# Patient Record
Sex: Female | Born: 1950 | Race: White | Hispanic: No | Smoking: Light tobacco smoker
Health system: Southern US, Community
[De-identification: ages and names within clinical notes are randomized; demographics above are authoritative.]

## PROBLEM LIST (undated history)

## (undated) DIAGNOSIS — M1990 Unspecified osteoarthritis, unspecified site: Secondary | ICD-10-CM

## (undated) DIAGNOSIS — I4891 Unspecified atrial fibrillation: Secondary | ICD-10-CM

---

## 2004-07-24 ENCOUNTER — Ambulatory Visit: Payer: Self-pay

## 2004-12-11 ENCOUNTER — Ambulatory Visit: Payer: Self-pay | Admitting: Physician Assistant

## 2004-12-20 ENCOUNTER — Ambulatory Visit: Payer: Self-pay | Admitting: Pain Medicine

## 2005-01-08 ENCOUNTER — Ambulatory Visit: Payer: Self-pay | Admitting: Physician Assistant

## 2005-01-22 ENCOUNTER — Ambulatory Visit: Payer: Self-pay | Admitting: Pain Medicine

## 2005-02-21 ENCOUNTER — Ambulatory Visit: Payer: Self-pay | Admitting: Physician Assistant

## 2005-02-28 ENCOUNTER — Ambulatory Visit: Payer: Self-pay | Admitting: Pain Medicine

## 2005-03-12 ENCOUNTER — Ambulatory Visit: Payer: Self-pay | Admitting: Pain Medicine

## 2005-03-25 ENCOUNTER — Ambulatory Visit: Payer: Self-pay | Admitting: Pain Medicine

## 2005-06-03 ENCOUNTER — Ambulatory Visit: Payer: Self-pay | Admitting: General Surgery

## 2005-06-10 ENCOUNTER — Other Ambulatory Visit: Payer: Self-pay

## 2005-06-17 ENCOUNTER — Inpatient Hospital Stay: Payer: Self-pay | Admitting: General Surgery

## 2005-12-31 ENCOUNTER — Ambulatory Visit: Payer: Self-pay

## 2009-09-19 ENCOUNTER — Ambulatory Visit: Payer: Self-pay | Admitting: Internal Medicine

## 2011-04-03 ENCOUNTER — Ambulatory Visit: Payer: Self-pay | Admitting: Internal Medicine

## 2012-03-30 ENCOUNTER — Inpatient Hospital Stay: Payer: Self-pay | Admitting: Family Medicine

## 2012-03-30 LAB — URINALYSIS, COMPLETE
Bilirubin,UR: NEGATIVE
Blood: NEGATIVE
Glucose,UR: NEGATIVE mg/dL (ref 0–75)
Ketone: NEGATIVE
Leukocyte Esterase: NEGATIVE
Nitrite: NEGATIVE
Ph: 7 (ref 4.5–8.0)
Specific Gravity: 1.009 (ref 1.003–1.030)
Squamous Epithelial: 2
WBC UR: 2 /HPF (ref 0–5)

## 2012-03-30 LAB — CBC
MCHC: 32.7 g/dL (ref 32.0–36.0)
Platelet: 396 10*3/uL (ref 150–440)
RBC: 4.63 10*6/uL (ref 3.80–5.20)
WBC: 12.4 10*3/uL — ABNORMAL HIGH (ref 3.6–11.0)

## 2012-03-30 LAB — COMPREHENSIVE METABOLIC PANEL
Albumin: 3.9 g/dL (ref 3.4–5.0)
Alkaline Phosphatase: 112 U/L (ref 50–136)
Anion Gap: 6 — ABNORMAL LOW (ref 7–16)
Bilirubin,Total: 0.7 mg/dL (ref 0.2–1.0)
Calcium, Total: 9.2 mg/dL (ref 8.5–10.1)
Co2: 34 mmol/L — ABNORMAL HIGH (ref 21–32)
EGFR (Non-African Amer.): >60
Glucose: 123 mg/dL — ABNORMAL HIGH (ref 65–99)
Osmolality: 265 (ref 275–301)
SGPT (ALT): 34 U/L (ref 12–78)
Sodium: 133 mmol/L — ABNORMAL LOW (ref 136–145)
Total Protein: 7.8 g/dL (ref 6.4–8.2)

## 2012-03-31 LAB — CBC WITH DIFFERENTIAL/PLATELET
Basophil %: 0.2 %
Eosinophil #: 0 10*3/uL (ref 0.0–0.7)
Eosinophil %: 0.2 %
HCT: 43.3 % (ref 35.0–47.0)
HGB: 15.2 g/dL (ref 12.0–16.0)
Lymphocyte #: 1.5 10*3/uL (ref 1.0–3.6)
Lymphocyte %: 10.1 %
MCV: 100 fL (ref 80–100)
Monocyte #: 0.7 x10 3/mm (ref 0.2–0.9)
Neutrophil #: 12.3 10*3/uL — ABNORMAL HIGH (ref 1.4–6.5)
Neutrophil %: 84.9 %
Platelet: 371 10*3/uL (ref 150–440)
RBC: 4.34 10*6/uL (ref 3.80–5.20)
RDW: 12.8 % (ref 11.5–14.5)
WBC: 14.4 10*3/uL — ABNORMAL HIGH (ref 3.6–11.0)

## 2012-03-31 LAB — BASIC METABOLIC PANEL
Anion Gap: 7 (ref 7–16)
BUN: 6 mg/dL — ABNORMAL LOW (ref 7–18)
Chloride: 95 mmol/L — ABNORMAL LOW (ref 98–107)
Co2: 31 mmol/L (ref 21–32)
Creatinine: 0.4 mg/dL — ABNORMAL LOW (ref 0.60–1.30)
EGFR (Non-African Amer.): >60
Potassium: 3.8 mmol/L (ref 3.5–5.1)
Sodium: 133 mmol/L — ABNORMAL LOW (ref 136–145)

## 2012-04-01 LAB — CBC WITH DIFFERENTIAL/PLATELET
Basophil #: 0 10*3/uL (ref 0.0–0.1)
Eosinophil #: 0 10*3/uL (ref 0.0–0.7)
HCT: 45.3 % (ref 35.0–47.0)
HGB: 15.7 g/dL (ref 12.0–16.0)
Lymphocyte %: 5.5 %
MCH: 34.7 pg — ABNORMAL HIGH (ref 26.0–34.0)
MCHC: 34.7 g/dL (ref 32.0–36.0)
Neutrophil #: 11.8 10*3/uL — ABNORMAL HIGH (ref 1.4–6.5)
Platelet: 377 10*3/uL (ref 150–440)
RDW: 12.5 % (ref 11.5–14.5)

## 2012-04-01 LAB — BASIC METABOLIC PANEL
Anion Gap: 8 (ref 7–16)
Calcium, Total: 9.3 mg/dL (ref 8.5–10.1)
Chloride: 91 mmol/L — ABNORMAL LOW (ref 98–107)
Co2: 31 mmol/L (ref 21–32)
Creatinine: 0.62 mg/dL (ref 0.60–1.30)
Osmolality: 263 (ref 275–301)

## 2012-04-02 LAB — CBC WITH DIFFERENTIAL/PLATELET
Basophil #: 0 10*3/uL (ref 0.0–0.1)
Basophil %: 0.1 %
Eosinophil #: 0 10*3/uL (ref 0.0–0.7)
Eosinophil %: 0 %
HCT: 44.4 % (ref 35.0–47.0)
Lymphocyte #: 0.7 10*3/uL — ABNORMAL LOW (ref 1.0–3.6)
Lymphocyte %: 5.4 %
Monocyte #: 0.3 x10 3/mm (ref 0.2–0.9)
Monocyte %: 2.3 %
Neutrophil #: 11.2 10*3/uL — ABNORMAL HIGH (ref 1.4–6.5)
RBC: 4.43 10*6/uL (ref 3.80–5.20)
RDW: 12.6 % (ref 11.5–14.5)
WBC: 12.2 10*3/uL — ABNORMAL HIGH (ref 3.6–11.0)

## 2012-04-02 LAB — BASIC METABOLIC PANEL
Anion Gap: 6 — ABNORMAL LOW (ref 7–16)
BUN: 13 mg/dL (ref 7–18)
Co2: 35 mmol/L — ABNORMAL HIGH (ref 21–32)
Creatinine: 0.56 mg/dL — ABNORMAL LOW (ref 0.60–1.30)
EGFR (African American): >60
EGFR (Non-African Amer.): >60
Sodium: 131 mmol/L — ABNORMAL LOW (ref 136–145)

## 2012-04-03 LAB — CBC WITH DIFFERENTIAL/PLATELET
Basophil #: 0 10*3/uL (ref 0.0–0.1)
Basophil %: 0.2 %
Eosinophil #: 0 10*3/uL (ref 0.0–0.7)
HCT: 44.1 % (ref 35.0–47.0)
HGB: 15.3 g/dL (ref 12.0–16.0)
Lymphocyte #: 1.5 10*3/uL (ref 1.0–3.6)
Lymphocyte %: 11.7 %
MCH: 34.4 pg — ABNORMAL HIGH (ref 26.0–34.0)
MCHC: 34.7 g/dL (ref 32.0–36.0)
MCV: 99 fL (ref 80–100)
Monocyte %: 10.7 %
Neutrophil #: 10.1 10*3/uL — ABNORMAL HIGH (ref 1.4–6.5)

## 2012-04-03 LAB — BASIC METABOLIC PANEL
Creatinine: 0.46 mg/dL — ABNORMAL LOW (ref 0.60–1.30)
EGFR (African American): >60
EGFR (Non-African Amer.): >60
Glucose: 109 mg/dL — ABNORMAL HIGH (ref 65–99)
Osmolality: 273 (ref 275–301)
Potassium: 3.6 mmol/L (ref 3.5–5.1)
Sodium: 136 mmol/L (ref 136–145)

## 2012-04-18 ENCOUNTER — Emergency Department: Payer: Self-pay | Admitting: Emergency Medicine

## 2012-04-18 LAB — COMPREHENSIVE METABOLIC PANEL
Albumin: 3.2 g/dL — ABNORMAL LOW (ref 3.4–5.0)
Anion Gap: 6 — ABNORMAL LOW (ref 7–16)
BUN: 8 mg/dL (ref 7–18)
Calcium, Total: 8.3 mg/dL — ABNORMAL LOW (ref 8.5–10.1)
Co2: 35 mmol/L — ABNORMAL HIGH (ref 21–32)
EGFR (African American): >60
EGFR (Non-African Amer.): >60
Glucose: 108 mg/dL — ABNORMAL HIGH (ref 65–99)
Potassium: 3.2 mmol/L — ABNORMAL LOW (ref 3.5–5.1)
SGOT(AST): 31 U/L (ref 15–37)
Sodium: 138 mmol/L (ref 136–145)

## 2012-04-18 LAB — CBC
HCT: 38.5 % (ref 35.0–47.0)
HGB: 13.1 g/dL (ref 12.0–16.0)
MCH: 33.3 pg (ref 26.0–34.0)
Platelet: 327 10*3/uL (ref 150–440)
RDW: 12.7 % (ref 11.5–14.5)
WBC: 12.5 10*3/uL — ABNORMAL HIGH (ref 3.6–11.0)

## 2012-04-18 LAB — TROPONIN I: Troponin-I: <0.02 ng/mL

## 2012-04-18 LAB — CK TOTAL AND CKMB (NOT AT ARMC): CK, Total: 132 U/L (ref 21–215)

## 2012-05-04 ENCOUNTER — Inpatient Hospital Stay: Payer: Self-pay

## 2012-05-04 LAB — CBC
MCH: 32.4 pg (ref 26.0–34.0)
Platelet: 367 10*3/uL (ref 150–440)
RDW: 13.4 % (ref 11.5–14.5)
WBC: 15.6 10*3/uL — ABNORMAL HIGH (ref 3.6–11.0)

## 2012-05-04 LAB — BASIC METABOLIC PANEL
Anion Gap: 4 — ABNORMAL LOW (ref 7–16)
BUN: 8 mg/dL (ref 7–18)
Co2: 35 mmol/L — ABNORMAL HIGH (ref 21–32)
EGFR (African American): >60
EGFR (Non-African Amer.): >60
Glucose: 126 mg/dL — ABNORMAL HIGH (ref 65–99)
Osmolality: 274 (ref 275–301)
Potassium: 3.9 mmol/L (ref 3.5–5.1)

## 2012-05-04 LAB — CK TOTAL AND CKMB (NOT AT ARMC): CK, Total: 93 U/L (ref 21–215)

## 2012-05-05 LAB — BASIC METABOLIC PANEL
Anion Gap: 3 — ABNORMAL LOW (ref 7–16)
BUN: 11 mg/dL (ref 7–18)
Chloride: 97 mmol/L — ABNORMAL LOW (ref 98–107)
Co2: 36 mmol/L — ABNORMAL HIGH (ref 21–32)
Creatinine: 0.47 mg/dL — ABNORMAL LOW (ref 0.60–1.30)
EGFR (Non-African Amer.): >60
Glucose: 155 mg/dL — ABNORMAL HIGH (ref 65–99)
Osmolality: 274 (ref 275–301)
Potassium: 3.9 mmol/L (ref 3.5–5.1)
Sodium: 136 mmol/L (ref 136–145)

## 2012-05-05 LAB — CBC WITH DIFFERENTIAL/PLATELET
Basophil #: 0 10*3/uL (ref 0.0–0.1)
Basophil %: 0.2 %
HGB: 12.3 g/dL (ref 12.0–16.0)
Lymphocyte #: 0.5 10*3/uL — ABNORMAL LOW (ref 1.0–3.6)
MCHC: 33 g/dL (ref 32.0–36.0)
Monocyte #: 0.3 x10 3/mm (ref 0.2–0.9)
Neutrophil #: 13.1 10*3/uL — ABNORMAL HIGH (ref 1.4–6.5)
Neutrophil %: 93.9 %
RDW: 13.2 % (ref 11.5–14.5)

## 2012-05-05 LAB — MAGNESIUM: Magnesium: 2 mg/dL

## 2012-05-06 LAB — BASIC METABOLIC PANEL
Chloride: 90 mmol/L — ABNORMAL LOW (ref 98–107)
Co2: 38 mmol/L — ABNORMAL HIGH (ref 21–32)
Creatinine: 0.44 mg/dL — ABNORMAL LOW (ref 0.60–1.30)
Glucose: 141 mg/dL — ABNORMAL HIGH (ref 65–99)
Osmolality: 267 (ref 275–301)
Potassium: 4.3 mmol/L (ref 3.5–5.1)

## 2012-05-07 LAB — BASIC METABOLIC PANEL
EGFR (Non-African Amer.): >60
Glucose: 157 mg/dL — ABNORMAL HIGH (ref 65–99)
Osmolality: 275 (ref 275–301)
Sodium: 135 mmol/L — ABNORMAL LOW (ref 136–145)

## 2012-05-09 LAB — CBC WITH DIFFERENTIAL/PLATELET
Basophil #: 0 10*3/uL (ref 0.0–0.1)
Eosinophil #: 0 10*3/uL (ref 0.0–0.7)
HCT: 39.5 % (ref 35.0–47.0)
HGB: 13.3 g/dL (ref 12.0–16.0)
Lymphocyte #: 0.6 10*3/uL — ABNORMAL LOW (ref 1.0–3.6)
Lymphocyte %: 7 %
MCH: 33.3 pg (ref 26.0–34.0)
MCHC: 33.7 g/dL (ref 32.0–36.0)
Monocyte #: 0.5 x10 3/mm (ref 0.2–0.9)
Monocyte %: 5.8 %
Neutrophil #: 7.1 10*3/uL — ABNORMAL HIGH (ref 1.4–6.5)
RDW: 13 % (ref 11.5–14.5)

## 2012-05-09 LAB — BASIC METABOLIC PANEL
Chloride: 93 mmol/L — ABNORMAL LOW (ref 98–107)
EGFR (African American): >60
Glucose: 122 mg/dL — ABNORMAL HIGH (ref 65–99)
Sodium: 139 mmol/L (ref 136–145)

## 2012-06-25 ENCOUNTER — Ambulatory Visit: Payer: Self-pay

## 2013-09-15 ENCOUNTER — Inpatient Hospital Stay: Payer: Self-pay | Admitting: Family Medicine

## 2013-09-15 LAB — CBC WITH DIFFERENTIAL/PLATELET
BASOS ABS: 0 10*3/uL (ref 0.0–0.1)
Basophil %: 0.4 %
Eosinophil #: 0.2 10*3/uL (ref 0.0–0.7)
Eosinophil %: 2.6 %
HCT: 38.4 % (ref 35.0–47.0)
HGB: 12.6 g/dL (ref 12.0–16.0)
LYMPHS ABS: 1.4 10*3/uL (ref 1.0–3.6)
LYMPHS PCT: 15.5 %
MCH: 33.3 pg (ref 26.0–34.0)
MCHC: 32.8 g/dL (ref 32.0–36.0)
MCV: 102 fL — ABNORMAL HIGH (ref 80–100)
MONO ABS: 0.8 x10 3/mm (ref 0.2–0.9)
Monocyte %: 8.3 %
NEUTROS ABS: 6.8 10*3/uL — AB (ref 1.4–6.5)
Neutrophil %: 73.2 %
Platelet: 251 10*3/uL (ref 150–440)
RBC: 3.78 10*6/uL — ABNORMAL LOW (ref 3.80–5.20)
RDW: 13.9 % (ref 11.5–14.5)
WBC: 9.3 10*3/uL (ref 3.6–11.0)

## 2013-09-15 LAB — COMPREHENSIVE METABOLIC PANEL
ANION GAP: 4 — AB (ref 7–16)
Albumin: 3.3 g/dL — ABNORMAL LOW (ref 3.4–5.0)
Alkaline Phosphatase: 113 U/L
BILIRUBIN TOTAL: 0.5 mg/dL (ref 0.2–1.0)
BUN: 7 mg/dL (ref 7–18)
CALCIUM: 9 mg/dL (ref 8.5–10.1)
CHLORIDE: 95 mmol/L — AB (ref 98–107)
Co2: 39 mmol/L — ABNORMAL HIGH (ref 21–32)
Creatinine: 0.42 mg/dL — ABNORMAL LOW (ref 0.60–1.30)
EGFR (African American): >60
GLUCOSE: 126 mg/dL — AB (ref 65–99)
OSMOLALITY: 275 (ref 275–301)
Potassium: 4 mmol/L (ref 3.5–5.1)
SGOT(AST): 42 U/L — ABNORMAL HIGH (ref 15–37)
SGPT (ALT): 33 U/L (ref 12–78)
Sodium: 138 mmol/L (ref 136–145)
TOTAL PROTEIN: 6.7 g/dL (ref 6.4–8.2)

## 2013-09-15 LAB — TSH: THYROID STIMULATING HORM: 2.42 u[IU]/mL

## 2013-09-15 LAB — TROPONIN I

## 2013-09-15 LAB — PRO B NATRIURETIC PEPTIDE: B-Type Natriuretic Peptide: 959 pg/mL — ABNORMAL HIGH (ref 0–125)

## 2013-09-16 LAB — BASIC METABOLIC PANEL
ANION GAP: 4 — AB (ref 7–16)
BUN: 9 mg/dL (ref 7–18)
CO2: 39 mmol/L — AB (ref 21–32)
CREATININE: 0.54 mg/dL — AB (ref 0.60–1.30)
Calcium, Total: 9.3 mg/dL (ref 8.5–10.1)
Chloride: 94 mmol/L — ABNORMAL LOW (ref 98–107)
EGFR (Non-African Amer.): >60
GLUCOSE: 169 mg/dL — AB (ref 65–99)
OSMOLALITY: 276 (ref 275–301)
POTASSIUM: 3.6 mmol/L (ref 3.5–5.1)
SODIUM: 137 mmol/L (ref 136–145)

## 2013-09-16 LAB — CBC WITH DIFFERENTIAL/PLATELET
Basophil #: 0 10*3/uL (ref 0.0–0.1)
Basophil %: 0.2 %
EOS PCT: 0 %
Eosinophil #: 0 10*3/uL (ref 0.0–0.7)
HCT: 39.1 % (ref 35.0–47.0)
HGB: 12.6 g/dL (ref 12.0–16.0)
LYMPHS ABS: 0.3 10*3/uL — AB (ref 1.0–3.6)
Lymphocyte %: 3.7 %
MCH: 32.8 pg (ref 26.0–34.0)
MCHC: 32.3 g/dL (ref 32.0–36.0)
MCV: 101 fL — AB (ref 80–100)
Monocyte #: 0.1 x10 3/mm — ABNORMAL LOW (ref 0.2–0.9)
Monocyte %: 1 %
NEUTROS PCT: 95.1 %
Neutrophil #: 6.8 10*3/uL — ABNORMAL HIGH (ref 1.4–6.5)
Platelet: 265 10*3/uL (ref 150–440)
RBC: 3.86 10*6/uL (ref 3.80–5.20)
RDW: 14 % (ref 11.5–14.5)
WBC: 7.1 10*3/uL (ref 3.6–11.0)

## 2013-09-16 LAB — TSH: Thyroid Stimulating Horm: 0.856 u[IU]/mL

## 2013-10-15 IMAGING — CR DG CHEST 2V
1 series · 3 of 3 positions shown · non-contrast
Comparison: none

REASON FOR EXAM: SOB
COMMENTS:

PROCEDURE:     DXR - DXR CHEST PA (OR AP) AND LATERAL  - May 04, 2012  [DATE]
RESULT:     Comparison: None

[Series 1: w chest lat · 0.14mm/px · 3 of 3 slices shown]
[im 1/3]
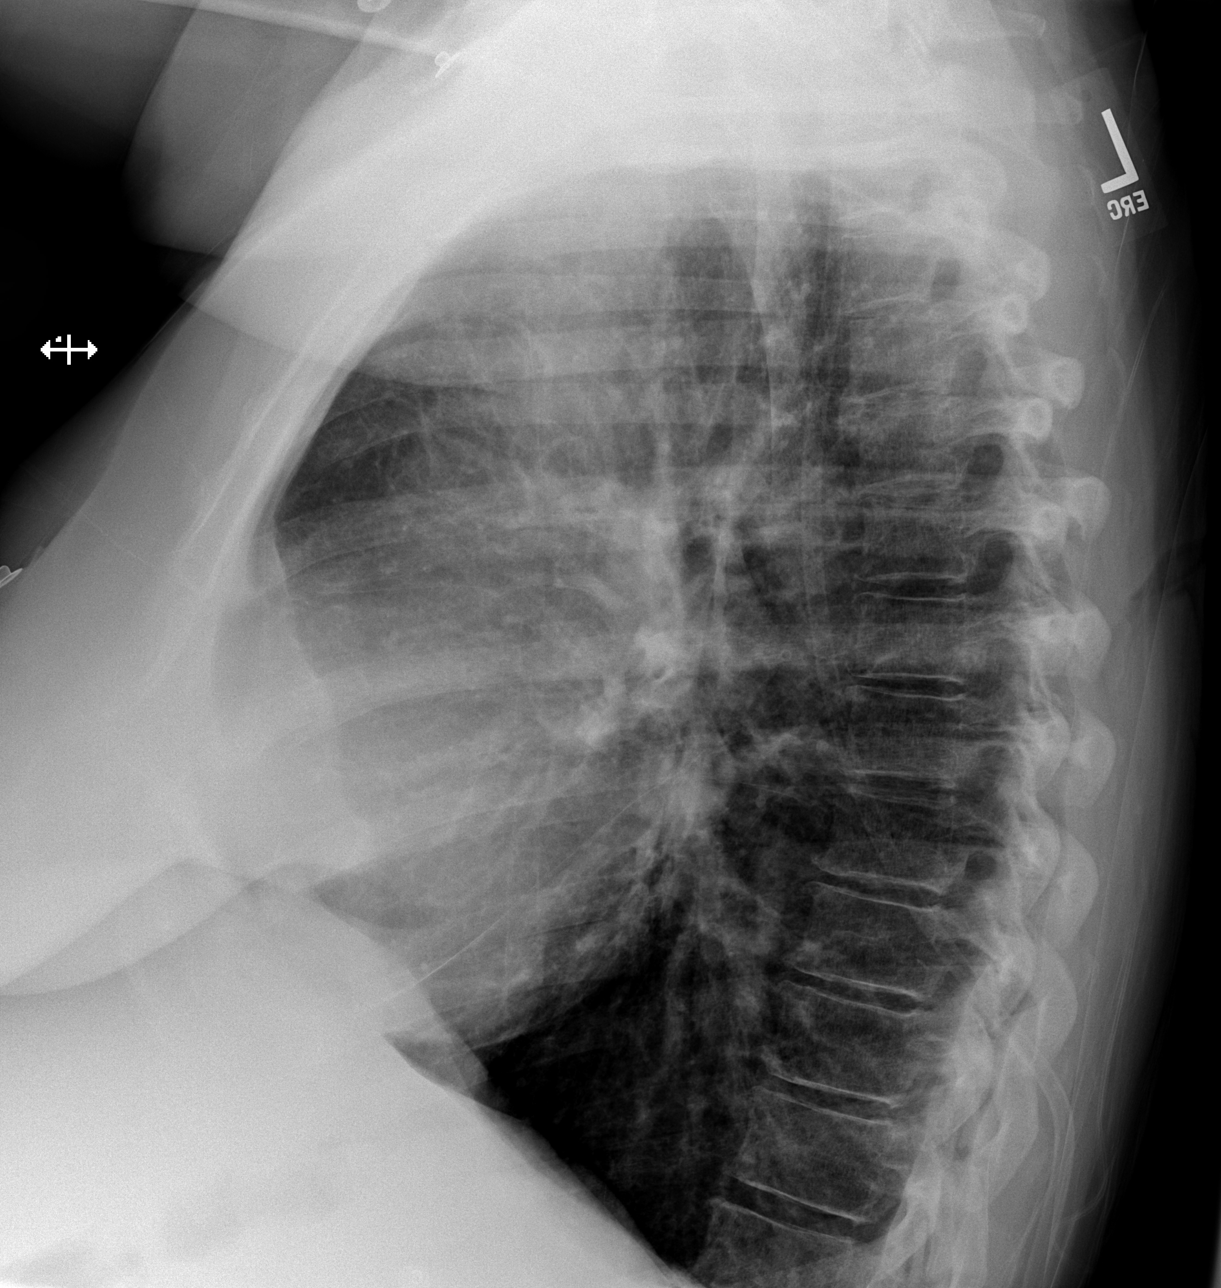
[im 2/3]
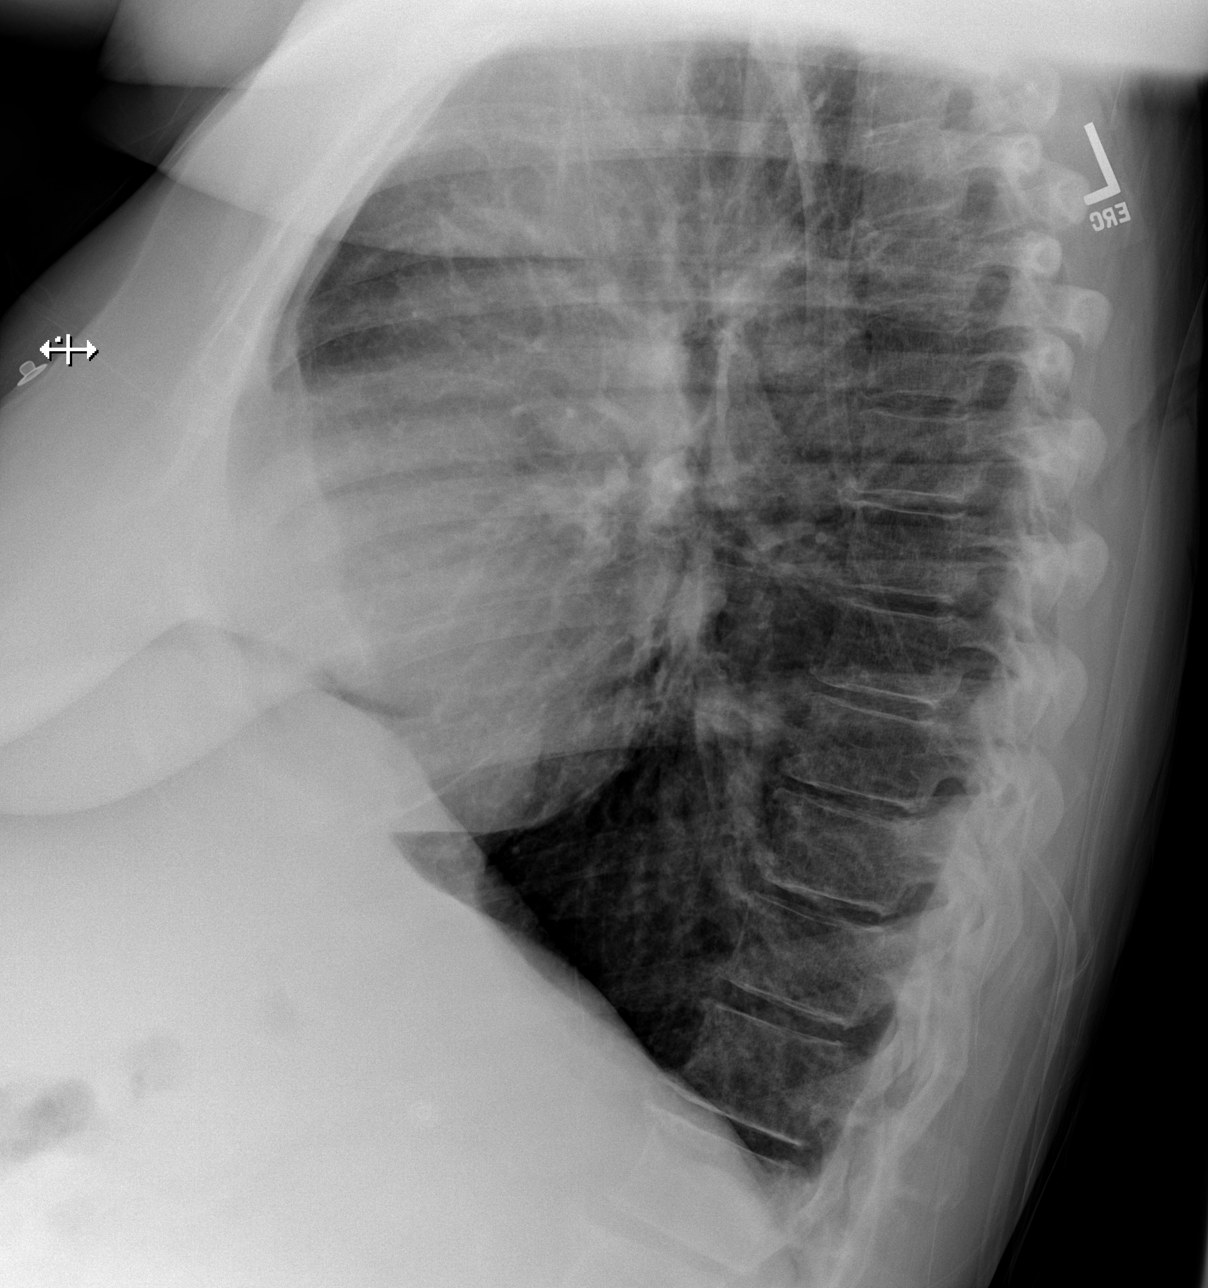
[im 3/3]
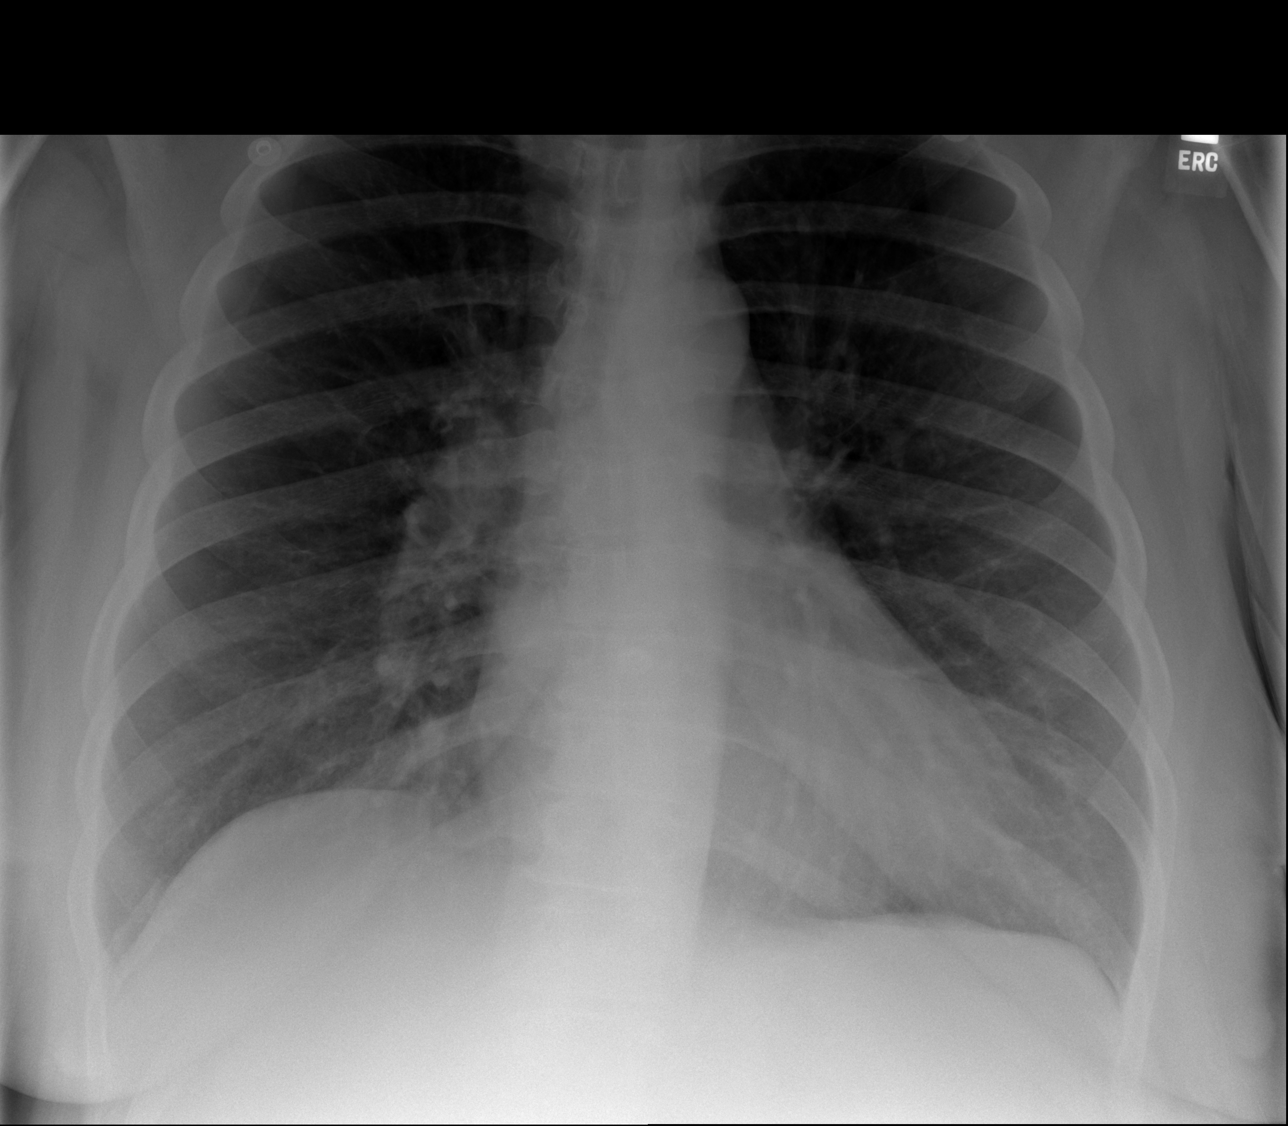

[3 of 3 positions shown; findings below may reference images not displayed]

FINDINGS: PA and lateral chest radiographs are provided.  There is no focal
parenchymal opacity, pleural effusion, or pneumothorax. The heart and
mediastinum are unremarkable.  The osseous structures are unremarkable.
IMPRESSION: No acute disease of the che[REDACTED]

## 2014-03-08 ENCOUNTER — Ambulatory Visit: Payer: Self-pay

## 2014-03-16 ENCOUNTER — Ambulatory Visit: Payer: Self-pay

## 2014-08-09 NOTE — Discharge Summary (Signed)
PATIENT NAME:  Julie Escobar, Julie Escobar MR#:  [id number] DATE OF BIRTH:  [birth year]  DATE OF ADMISSION:  03/30/2012 DATE OF DISCHARGE:  04/03/2012   DISCHARGE DIAGNOSES:  1. Chronic obstructive pulmonary disease exacerbation.  2. Hypertension.   DISCHARGE MEDICATIONS:  1. Hydrochlorothiazide 25 mg p.o. daily.  2. Proventil HFA 90 mcg 2 puffs every four hours p.r.n. for shortness of breath.  3. Advair 250/50, one puff b.i.d.  4. Spiriva 18 mcg 1 inhalation daily.  5. Multivitamin 1 tab daily.  6. Tylenol 325 mg 2 tabs every four hours p.r.n. for pain and fever.  7. Albuterol 2.5 mg/3 mL nebulizers q.6 hours p.r.n. for wheezing.  8. Steroid prednisone Dosepak as directed.  9. Amlodipine 10 mg p.o. daily.  10. Azithromycin 250 mg x3 more days taken daily.   MEDICATIONS TO STOP: Atenolol.   CONSULTS: None.   PROCEDURES: None.   PERTINENT LABS ON DAY OF DISCHARGE: Sodium 136, potassium 3.6, creatinine 0.46, glucose 109. White blood cell count 13, hemoglobin 15.3 and platelets 355. She is sating 91% on 4 liters.   BRIEF HOSPITAL COURSE:  1. Chronic obstructive pulmonary disease exacerbation: The patient initially came in with complaints of cough and increased shortness of breath with underlying history of chronic obstructive pulmonary disease not on any oxygen. Chest x-ray showed no acute cardiopulmonary disease. Her clinical symptoms are consistent with chronic obstructive pulmonary disease exacerbation. She was started on IV steroids and antibiotics, IV azithromycin. Her symptoms have slowly improved and will complete a course of the prednisone taper. Also, three more days of azithromycin. Remain on home oxygen at 4 liters to keep her sats above 90%. I will also continue her home breathing medications of Advair and Spiriva and she has albuterol inhaler as needed.  2. Hypertension. Plan to stop the atenolol at this time because of chronic obstructive pulmonary disease exacerbation. It was replaced  with amlodipine 10 mg daily. Continue with hydrochlorothiazide 25 mg daily.   DISPOSITION: She is in stable condition, to be discharged to home.   FOLLOWUP: Follow up with Dr. Sampson GoonFitzgerald as planned on the 26th. She is to go home with O2.    ____________________________ Marisue IvanKanhka Hurley Sobel, MD kl:ap D: 04/03/2012 13:21:36 ET T: 04/03/2012 13:31:38 ET JOB#: [id number]  cc: Marisue IvanKanhka Kharma Sampsel, MD, <Dictator> Stann Mainlandavid P. Sampson GoonFitzgerald, MD

## 2014-08-09 NOTE — H&P (Signed)
PATIENT NAME:  Julie Escobar, Julie Escobar MR#:  [id number] DATE OF BIRTH:  [birth year]  DATE OF ADMISSION:  03/30/2012   PRIMARY CARE PHYSICIAN: Dr. Candelaria Stagers   REFERRING PHYSICIAN: Dr. Sampson Goon  CHIEF COMPLAINT: Cough, sputum, shortness of breath for three days.   HISTORY OF PRESENT ILLNESS: The patient is a 64 year old Caucasian female with a history of COPD and hypertension who presented to the ED with cough, sputum, and shortness of breath for three days. The patient has had COPD for many years but this is the first time to have a worsening cough, sputum and shortness of breath. The patient was treated with nebulizer, however, when the patient walked around the patient's oxygen saturation decreased to 80's. The patient denies any fever or chills. No headache or dizziness. No chest pain, palpitation, orthopnea, or nocturnal dyspnea. No leg edema.   PAST MEDICAL HISTORY:  1. COPD. 2. Hypertension.   PAST SURGICAL HISTORY: Hysterectomy.   FAMILY HISTORY: Hypertension.   SOCIAL HISTORY: The patient has been smoking 1 pack a day for many years but for the past two years the patient has been smoking 6 cigarettes a day. No alcohol drinking or illicit drugs.   ALLERGIES: Penicillin.   HOME MEDICATIONS:  1. Advair 250 mcg/50 mcg inhalation 1 puff b.i.d.  2. Albuterol 2.5 mg/3 mL 3 mL inhaled 6 hours.  3. Atenolol 50 mg p.o. daily.  4. Derma-Smoothe/FS body oil 0.01% topical apply topically to affected area p.r.n. for psoriasis.  5. HCTZ 25 mg p.o. daily.  6. Multivitamin 1 tablet p.o. daily.  7. Proventil HFA 90 mcg inhalation 2 puffs inhaled 4 to 6 hours p.r.n.  8. Spiriva 18 mcg inhalation capsule one cap once a day in the evening.  9. Triamcinolone 0.1% topical cream apply topically to affected area 3 times a day p.r.n. for psoriasis.  10. Tylenol 650 mg p.o. every four hours p.r.n.  11. Tylenol Extra Strength PM 500 mg/25 mg p.o. tablets once a day at bedtime p.r.n. for sleep.   REVIEW OF  SYSTEMS: CONSTITUTIONAL: The patient denies any fever or chills. No headache or dizziness. No weakness or weight loss. EYES: No double vision or blurred vision. ENT: No epistaxis, slurred speech, or dysphagia. CARDIOVASCULAR: No chest pain, palpitation, orthopnea, or nocturnal dyspnea. No leg edema. PULMONARY: Positive for cough, sputum, shortness of breath but no wheezing or hemoptysis. GI: No abdominal pain, nausea, vomiting, or diarrhea. No melena or bloody stool. GU: No dysuria, hematuria, or incontinence. SKIN: No rash or jaundice. HEMATOLOGY: No easy bruising or bleeding. NEUROLOGY: No seizure, loss of consciousness, or syncope.   PHYSICAL EXAMINATION:   VITAL SIGNS: Temperature 97.7, blood pressure 119/79, pulse 71, respirations 22, oxygen saturation 82% on room air.   GENERAL: The patient is alert, awake, oriented in no acute distress.   HEENT: Pupils round, equal, reactive to light and accommodation. Moist oral mucosa. Clear oropharynx.   NECK: Supple. No JVD or carotid bruit. No lymphadenopathy. No thyromegaly.   CARDIOVASCULAR: S1, S2 regular rate, rhythm. No murmurs or gallops.   PULMONARY: Bilateral air entry. No wheezing or rales but has every weak breath sounds bilaterally.   ABDOMEN: Soft. No distention or tenderness. No organomegaly. Bowel sounds present.   EXTREMITIES: Trace edema but no clubbing or cyanosis. No calf tenderness.   SKIN: No rash or jaundice.   NEUROLOGIC: Alert and oriented x3. No focal deficit. Power 5 out of 5. Sensation intact.   LABORATORY DATA: pH 7.4, pCO2 56, pO2 64 with FiO2  28%.   Chest x-ray no acute cardiopulmonary disease.   Lactic acid 1.2. WBC 12.4, hemoglobin 15, platelets 396, glucose 123, BUN 5, creatinine 0.45, sodium 133, potassium 3.9, chloride 93.   IMPRESSION:  1. COPD exacerbation.  2. Acute respiratory failure.  3. Leukocytosis.  4. Hyponatremia.  5. Tobacco abuse.   6. Hypertension.   PLAN OF TREATMENT:  1. The patient  will be admitted to medical floor. Will continue O2 by nasal cannula. Give nebulizer p.r.n. We will start Solu-Medrol IVPB. Give Zithromax. Continue Advair, Spiriva, and other home medications.  2. Follow-up CBC and BMP.  3. Smoking cessation was counseled for five minutes.   Discussed the patient's situation and plan of treatment with the patient.   TIME SPENT: About 50 minutes.   ____________________________ Shaune PollackQing Kindle Strohmeier, MD qc:drc D: 03/30/2012 17:42:27 ET T: 03/31/2012 06:22:18 ET JOB#: [id number]  cc: Shaune PollackQing Amaiya Scruton, MD, <Dictator> Jimmie Mollyon C. Candelaria Stagershaplin, MD Shaune PollackQING Breuna Loveall MD ELECTRONICALLY SIGNED 03/31/2012 14:31

## 2014-08-12 NOTE — H&P (Signed)
PATIENT NAME:  Julie Escobar, Julie Escobar MR#:  [id number] DATE OF BIRTH:  1951/02/10  DATE OF ADMISSION:  05/04/2012  REFERRING PHYSICIAN: Dr. Dorothea GlassmanPaul Malinda  PRIMARY CARE PHYSICIAN: Dr. Sampson GoonFitzgerald   CHIEF COMPLAINT: Shortness of breath with cough.   HISTORY OF PRESENT ILLNESS: The patient is a pleasant 64 year old female with significant past medical history of COPD, on home oxygen 3 liters nasal cannula, where she was discharged on a short course of prednisone and p.o. antibiotic. The patient reports over the last week after she finished her prednisone course she started to feel again short of breath with significant wheezing, mainly exertional, and started to have productive sputum over the last 48 hours which she described as white in color. The patient is at baseline 4 liters nasal cannula. The patient had decreased air entry which improved after some IV Solu-Medrol and nebulizer treatment, but she has remained hypoxic mainly on ambulation. Once she started to walk around the bed, her saturation dropped to 87% on 4 liters nasal cannula, so hospitalist service was requested to admit the patient for COPD exacerbation.  The patient's chest x-ray did not show any evidence of infiltrate, and she had some leukocytosis of 15.6, and this is most likely related to her recent prednisone taper.   PAST MEDICAL HISTORY: 1. COPD.  2. Hypertension. 3. Psoriasis.   4. History of cervical cancer, followed by Dr. Jacqulyn BathLong.  5. Tobacco abuse.  6. Left colectomy for diverticulitis and abscess.   PAST SURGICAL HISTORY: 1. Hysterectomy.  2. C-section.   ALLERGIES: PENICILLIN.   HOME MEDICATIONS: Tylenol 650 every 4 hours as needed, Spiriva 18 mcg inhalational daily, Proventil as needed. Finished recent prednisone tapering dose. Multivitamin 1 tablet daily, Lasix 20 mg as needed, atenolol 50 mg as needed, Advair Diskus 250/50, 1 puff 2 times a day.   REVIEW OF SYSTEMS:  CONSTITUTIONAL: Denies fever, fatigue, weight loss or  weight gain.  EYES: Denies blurry vision, double vision or pain.  ENT: Denies tinnitus, ear pain, hearing loss, allergy, epistaxis, has some nasal discharge.  RESPIRATORY: Denies any hemoptysis. Has complaints of cough productive white in color. Has complaints of wheezing.  CARDIOVASCULAR: Denies any chest pain, arrhythmia, edema, palpitations, syncope.  GASTROINTESTINAL: Denies nausea, vomiting, diarrhea, abdominal pain, hematemesis.  GENITOURINARY: denies  dysuria, hematuria, or renal colic.  ENDOCRINE: Denies polyuria, polydipsia, heat or cold intolerance.  HEMATOLOGY: Denies anemia, easy bruising, bleeding diathesis.  INTEGUMENTARY: Denies any acne. Has history of psoriasis.  MUSCULOSKELETAL: Denies any swelling, gout, redness, limited activity, arthritis or cramps.  NEUROLOGIC: Denies dysarthria, epilepsy, tremors, vertigo, cerebrovascular accident or seizures.  PSYCHIATRIC: Denies anxiety, insomnia, schizophrenia, bipolar or depression.   SOCIAL HISTORY: The patient reports she smokes. She cut back to 1/2 pack a day. No alcohol or illicit drugs.   FAMILY HISTORY: Significant for hypertension.   PHYSICAL EXAMINATION: VITAL SIGNS: Temperature 98.4, pulse 88, respiratory rate 20, blood pressure 118/58, saturating 98% on room air.  GENERAL: Elderly female sitting on bed in no apparent distress.  HEENT: Head atraumatic, normocephalic. Pupils are equal, reactive to light. Pink conjunctivae. Anicteric sclerae. Moist oral mucosa.  NECK: Supple. No thyromegaly. No JVD.  CHEST: Decreased air entry bilaterally with scattered wheezing. No rales or rhonchi.  CARDIOVASCULAR: S1, S2 heard. No rubs, murmur, or gallops.  ABDOMEN: Soft, nontender, nondistended. Bowel sounds present.  EXTREMITIES: Mild pedal edema. No clubbing. No cyanosis.  PSYCHIATRIC: Appropriate affect. Awake, alert x 3. Intact judgment and insight.  NEUROLOGIC: Cranial nerves are grossly intact.  Motor 5 out of 5. No focal  deficits.  SKIN: Normal skin turgor, warm and dry. Has psoriatic rash beneath the elbow.   PERTINENT LABORATORY, DIAGNOSTIC AND RADIOLOGICAL DATA:  Glucose 126, BUN 8, creatinine 0.53, sodium 137, potassium 3.9, chloride 98, CO2 35. Troponin less than 0.02. White blood cell 15.6, hemoglobin 12.4, hematocrit 37.8, platelets 367. Influenza negative.   Chest x-ray does not show any infiltrate.   ASSESSMENT AND PLAN:  1. Chronic obstructive pulmonary disease exacerbation: The patient has had recurrent admissions for COPD exacerbation. She will be admitted to the medical floor, will be started on IV Solu-Medrol 60 mg every 8 hours, and on DuoNebs every 4 hours and Albuterol as needed. We will continue her with Spiriva and Advair. Given the fact that she is having productive sputum, I will start her on IV Levaquin.  2. History of hypertension: Appears to be controlled. Continue with atenolol.  3. Leukocytosis: Most likely related to her recent steroid tapered dose. I will monitor, will repeat in the a.m.  4. Tobacco abuse: The patient was counseled at length with the family at the bedside. I will start on Nicoderm patch.  5. Deep vein thrombosis prophylaxis: Subcutaneous heparin.  6. Gastrointestinal prophylaxis: On PPI.   CODE STATUS:  FULL CODE.    TOTAL TIME SPENT ON ADMISSION AND PATIENT CARE: 55 minutes.  ____________________________ Starleen Arms, MD dse:cb D: 05/04/2012 07:29:48 ET T: 05/04/2012 09:38:05 ET JOB#: [id number]  cc: Starleen Arms, MD, <Dictator> Chisom Aust Teena Irani MD ELECTRONICALLY SIGNED 05/08/2012 7:36

## 2014-08-12 NOTE — Discharge Summary (Signed)
PATIENT NAME:  Julie Escobar, Julie Escobar MR#:  [id number] DATE OF BIRTH:  [birth year]  DATE OF ADMISSION:  05/04/2012 DATE OF DISCHARGE:  05/09/2012  PRIMARY CARE PHYSICIAN: Clydie Braunavid Shristi Scheib.  PULMONOLOGIST: Dr. Meredeth IdeFleming.  DISCHARGE DIAGNOSES: 1.  Chronic obstructive pulmonary disease exacerbation.  2.  Metabolic acidosis.  3.  Hypertension.  4.  Edema.   HISTORY OF PRESENT ILLNESS: Please see admission history and physical. Briefly, this is a 64 year old woman with hypertension and known COPD, who continues to smoke. She had a recent COPD exacerbation. She reports that she is on 3 L by nasal cannula at home. She just finished a prednisone course when she started to have increased shortness or breath, wheezing and sputum productive with white color. She increased her oxygen to 4 L. She was admitted for COPD exacerbation. She was noted to have a white count of 15 at admission.   HOSPITAL COURSE BY ISSUE:  1.  COPD. The patient was treated with Solu-Medrol, nebulizers as well as antibiotics and levofloxacin. She was negative for flu testing. She had slow steady improvement with this treatment.  2.  Edema. The patient had possible diastolic CHF, although she had a normal echo in the past. Her BNP was elevated. She was given one dose of Lasix and was negative 3 liters leading to a contraction alkalosis. At one point, her pH was 7.4, bicarbonate 54 and CO2 90. Given back a small amount of fluid, her bicarb improved. The day of discharge, she was feeling much better. She was eating and drinking.  3.  Hypertension. The patient was continued on her atenolol.   DISCHARGE MEDICATIONS:  1.  Proventil 2 puffs 4 hours as needed.  2.  Advair 250/50, 1 twice a day.  3.  Multivitamin once a day.  4.  Albuterol nebulizers every q. 6 hours as needed.  5.  Atenolol 50 mg once a day.  6.  Spiriva 18 mcg once a day.  7.  Tylenol p.r.n.  8.  Pro-Air 2 puffs q. 4 hours.  9.  Prednisone 20 mg tablets taper 3 tablets a day  for 4 days, then 2 tablets a day for 4 days, then 1 tablet a day for four days, then 1/2 tablet a day for 4 days.  10. Claritin 10 mg once a day.  11. Mucinex 1 tablet twice a day.  12. Melatonin 3 mg once a day.  13. Pantoprazole 1 a day.  14. Levofloxacin 500 mg once a day for 3 days.  15. Nicotine patch.  16. Diflucan 150 mg x 1.  17. Discharge home with home oxygen 2 to 4 L.   DISCHARGE DIET: Regular consistency.  FOLLOWUP: The patient will follow up with Dr. Sampson GoonFitzgerald in 1 to 2 weeks and Dr. Meredeth IdeFleming in 1 to 2 weeks.   This discharge took 35 minutes.   ____________________________ Stann Mainlandavid P. Sampson GoonFitzgerald, MD dpf:aw D: 05/12/2012 19:46:13 ET T: 05/13/2012 06:11:48 ET JOB#: [id number]  cc: Stann Mainlandavid P. Sampson GoonFitzgerald, MD, <Dictator> DR. Ludger Escobar Julie Maultsby MD ELECTRONICALLY SIGNED 05/20/2012 15:38

## 2014-08-13 NOTE — Discharge Summary (Signed)
PATIENT NAME:  Julie Escobar, Julie Escobar MR#:  [id number] DATE OF BIRTH:  1951-01-08  DATE OF ADMISSION:  09/15/2013 DATE OF DISCHARGE:  09/19/2013  DISCHARGE DIAGNOSES:  1. Chronic obstructive pulmonary disease exacerbation.  2. New onset atrial fibrillation on Xarelto.   DISCHARGE MEDICATIONS:  1. Advair 250/50 one puff b.i.d.  2. Multivitamin daily.  3. Atenolol 50 mg p.o. daily.  4. Spiriva 18 mcg p.o. daily.  5. ProAir 2 puffs every 4 hours as needed for wheezing and shortness of breath.  6. Loratadine 10 mg p.o. daily.  7. Pantoprazole 40 mg p.o. daily.  8. Guaifenesin 600 mg extended-release 1 tablet daily.  9. Tylenol 500 mg 2 tablets p.o. q. 6 hours as needed for pain.  10. Calcium/vitamin D 600/500, 1 tablet p.o. b.i.d.  11. Fluocinolone topical as directed.  12. Aluminum hydroxide, magnesium hydroxide, simethicone as directed.  13. Xarelto 20 mg p.o. daily.  14. Albuterol ipratropium 3mL q. 6 hours via nebulizer as needed for wheezing.  15. Levofloxacin 500 mg p.o. daily x 2 more days.  16. Prednisone taper as directed.   CONSULTS: None.   PROCEDURES: None.   PERTINENT LABORATORY AND STUDIES: None.   BRIEF HOSPITAL COURSE: The patient was initially admitted for chronic obstructive pulmonary disease exacerbation.   1. Has underlying chronic obstructive pulmonary disease, on 4 liters at home. She was placed on antibiotic therapy and prednisone taper which has improved with her clinical status. Will continue with 2 more days of Levaquin and complete the prednisone taper as directed.  2. New onset atrial fibrillation. Patient currently asymptomatic and rate control. We will continue with the atenolol and continue with the Xarelto at this time. May potentially discontinue this in the future if she goes back to sinus rhythm.  3. Other chronic issues are stable.   DISPOSITION: She is in stable condition and will be discharged to home. Will follow up with Dr. Sherrie MustacheFisher as an outpatient in  1-2 weeks.    ____________________________ Marisue IvanKanhka Murle Otting, MD kl:dd D: 09/19/2013 12:10:00 ET T: 09/19/2013 17:54:01 ET JOB#: [id number]  cc: Marisue IvanKanhka Kristena Wilhelmi, MD, <Dictator> Marisue IvanKANHKA Cristina Mattern MD ELECTRONICALLY SIGNED 09/20/2013 8:28

## 2014-08-13 NOTE — H&P (Signed)
PATIENT NAME:  Julie CabalDWARDS, Little W MR#:  [id number] DATE OF BIRTH:  [birth year]  DATE OF ADMISSION:  09/15/2013  PRIMARY CARE PHYSICIAN:  Stann Mainlandavid P. Sampson GoonFitzgerald, MD  REQUESTING PHYSICIAN:  Dorothea GlassmanPaul Malinda, MD  CHIEF COMPLAINT: Shortness of breath and cough.  HISTORY OF PRESENT ILLNESS: The patient is a 64 year old female with a known history of chronic obstructive pulmonary disease, on 3 to 4 liters oxygen at home. Started feeling worsening shortness of breath since Monday. She took several nebulizer treatments, ProAir, was not feeling better. She had some cough which was nonproductive. She was able to sleep but as soon as she woke up she was not able to get any relief from breathing trouble and decided to come to the Emergency Department today. In the ED, she still feels that she is unable to catch breath and is being admitted for further evaluation and management.  PAST MEDICAL HISTORY:   1.  Chronic obstructive pulmonary disease, on 3 to 4 liters oxygen via nasal cannula.  2.  Hypertension.  3.  Psoriasis.  4.  History of cervical cancer, followed by Dr. Jacqulyn BathLong. 5.  Tobacco abuse.  6.  Left colectomy for diverticulitis and abscess.   PAST SURGICAL HISTORY: Hysterectomy and C-section.   ALLERGIES: PENICILLIN.   SOCIAL HISTORY:  She smokes 1/2 pack of cigarettes daily for the last 30 to 40 years. No alcohol or illicit drugs.   FAMILY HISTORY: Positive for hypertension.   REVIEW OF SYSTEMS:    CONSTITUTIONAL: No fever. Positive fatigue and weakness.  EYES: No blurred or double vision.  ENT: No tinnitus or ear pain.  RESPIRATORY: Positive for nonproductive cough and wheezing. No hemoptysis. Positive for dyspnea and chronic obstructive pulmonary disease with ongoing smoking.  CARDIOVASCULAR: No chest pain, orthopnea, edema.  GASTROINTESTINAL: No nausea, vomiting, diarrhea.  GENITOURINARY:  No dysuria or hematuria.  ENDOCRINE: No polyuria, nocturia.  HEMATOLOGY:  No anemia or easy bruising.   SKIN: No rash or lesion.  NEUROLOGIC: No tingling, numbness, weakness.  SKIN: History of psoriasis, but no rash or lesion or acne.  MUSCULOSKELETAL: No joint pain or arthritis.  PSYCHIATRIC: No history of anxiety or depression.   MEDICATIONS AT HOME: 1.  Advair 250/50 one puff b.i.d.  2.  Albuterol 3 mL inhaled every 6 hours.  3.  Atenolol 50 mg p.o. daily.  4.  Calcium with vitamin D 1 tablet p.o. b.i.d.  5.  Fluocinolone topical 0.01% on apply topically to affected area 3 times a week. 6.  Guaifenesin 600 mg p.o. daily.  7.  Loratadine 10 mg p.o. daily.  8.  Multivitamin once daily.  9.  Nicotine patch 21 mg transdermal daily.  10.  Protonix 40 mg p.o. daily.  11.  Prednisone 5 mg p.o. every other day at bedtime.  12.  ProAir HFA 2 puffs inhaled every 4 hours as needed.  13.  Spiriva once daily.  14.  Triamcinolone topical 0.1% to affected area once a day.   PHYSICAL EXAMINATION: VITAL SIGNS: Temperature 99.1, heart rate 90 per minute, respirations 18 per minute, blood pressure 155/83. She is saturating 96% on room air.  GENERAL: The patient is a 64 year old female lying in bed in acute respiratory distress.  EYES: Pupils equal, round, equal and accommodation. No scleral icterus. Extraocular muscles intact.  HEENT: Head is normocephalic, atraumatic.  Oropharynx and nasopharynx clear.  NECK: Supple. No JVD.  No thyroid enlargement or tenderness.   LUNGS: Decreased breath sounds at the bases, wheezing throughout both lungs. Mild  use of accessory muscles of respiration.  CARDIOVASCULAR: S1, S2 normal. No murmurs or gallops.  ABDOMEN: Soft, nontender, nondistended. Bowel sounds present. No organomegaly or mass.  EXTREMITIES: No pedal edema, cyanosis or clubbing.  NEUROLOGIC: Cranial nerves II through XII intact. Muscle strength 5 out of 5 in extremities. Sensation intact.  PSYCHIATRIC: The patient is alert and oriented x 3.  SKIN: No obvious rash, lesion, ulcer.   MUSCULOSKELETAL:  No joint effusion or tenderness.   LABORATORY, DIAGNOSTIC AND RADIOLOGICAL DATA:   1.  Normal BMP, sugar of 126. Normal liver function tests except AST of 42. Normal first set of troponins. Normal TSH. Normal CBC except MCV of 102.  2.  Chest x-ray in the ED showed no acute cardiopulmonary disease.   IMPRESSION AND PLAN: 1.  Chronic obstructive pulmonary disease exacerbation. Was started on IV steroids, antibiotic, nebulizer breathing treatment, Advair and Spiriva.  Consider pulmonary consult if needed.  2.  Hypertension. We will continue home medication and monitor her blood pressure and adjust as needed.  3.  Tobacco abuse. She was counseled for about 3 minutes. She is trying to quit, denies any need for nicotine replacement therapy while in the hospital.   TOTAL TIME TAKING CARE OF THIS PATIENT:  40 minutes.    ____________________________ Ellamae Sia. Sherryll Burger, MD vss:cs D: 09/15/2013 15:23:40 ET T: 09/15/2013 15:36:38 ET JOB#: [id number]  cc: Shany Marinez S. Sherryll Burger, MD, <Dictator> Stann Mainland. Sampson Goon, MD Ellamae Sia [facility] MD ELECTRONICALLY SIGNED 09/17/2013 10:38

## 2014-09-06 ENCOUNTER — Other Ambulatory Visit: Payer: Self-pay | Admitting: Infectious Diseases

## 2014-09-06 DIAGNOSIS — R928 Other abnormal and inconclusive findings on diagnostic imaging of breast: Secondary | ICD-10-CM

## 2014-09-29 ENCOUNTER — Ambulatory Visit: Payer: BC Managed Care – PPO

## 2014-09-29 ENCOUNTER — Other Ambulatory Visit: Payer: BC Managed Care – PPO

## 2014-10-11 ENCOUNTER — Ambulatory Visit
Admission: RE | Admit: 2014-10-11 | Discharge: 2014-10-11 | Disposition: A | Discharge status: Home / Self Care | Payer: BC Managed Care – PPO | Source: Ambulatory Visit | Attending: Infectious Diseases | Admitting: Infectious Diseases

## 2014-10-11 ENCOUNTER — Other Ambulatory Visit: Payer: Self-pay | Admitting: Infectious Diseases

## 2014-10-11 ENCOUNTER — Ambulatory Visit: Payer: BC Managed Care – PPO

## 2014-10-11 DIAGNOSIS — R928 Other abnormal and inconclusive findings on diagnostic imaging of breast: Secondary | ICD-10-CM

## 2015-02-27 IMAGING — CR DG CHEST 2V
1 series · 2 of 2 positions shown · non-contrast
Comparison: September 15, 2013

CLINICAL DATA: COPD; difficulty breathing

EXAM:
CHEST  2 VIEW

[Series 1: w chest pa · 0.14mm/px · 2 of 2 slices shown]
[im 1/2]
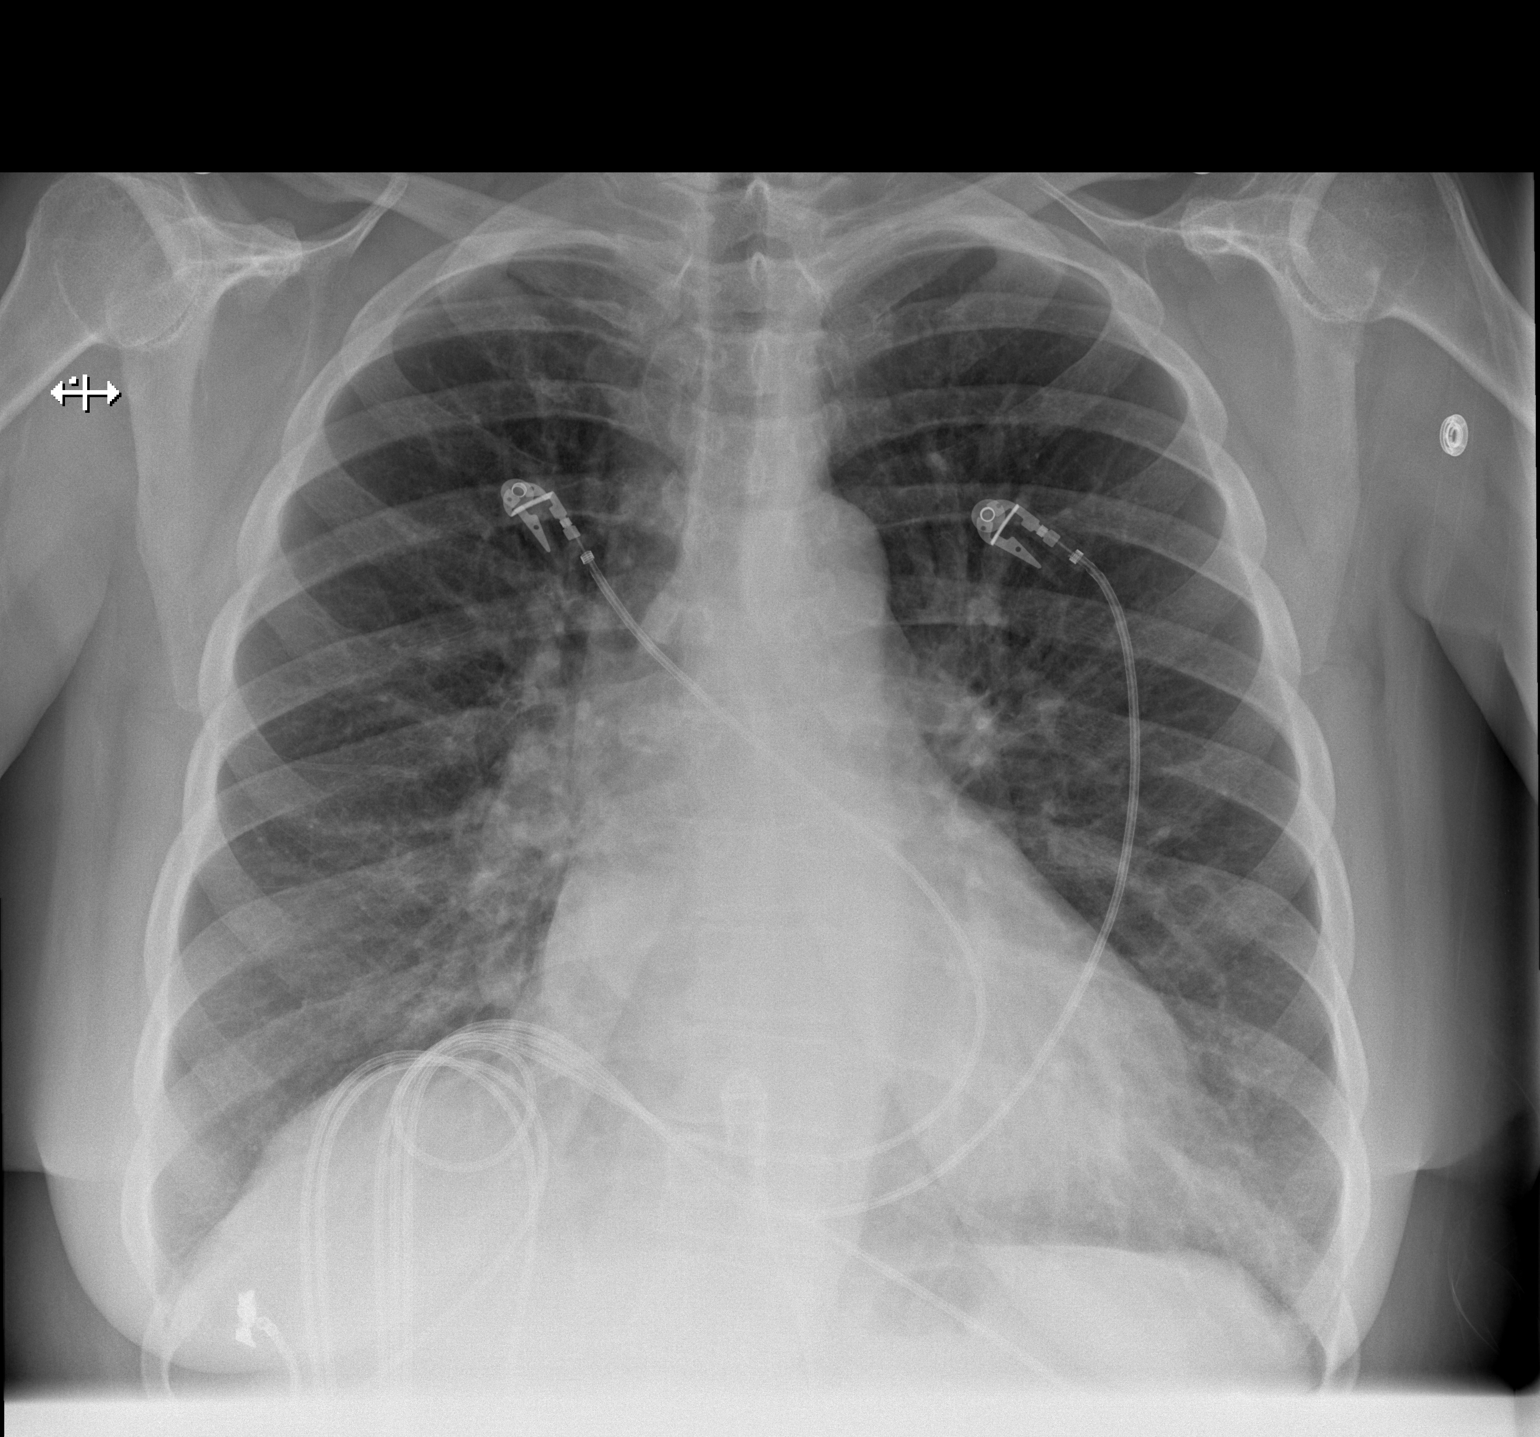
[im 2/2]
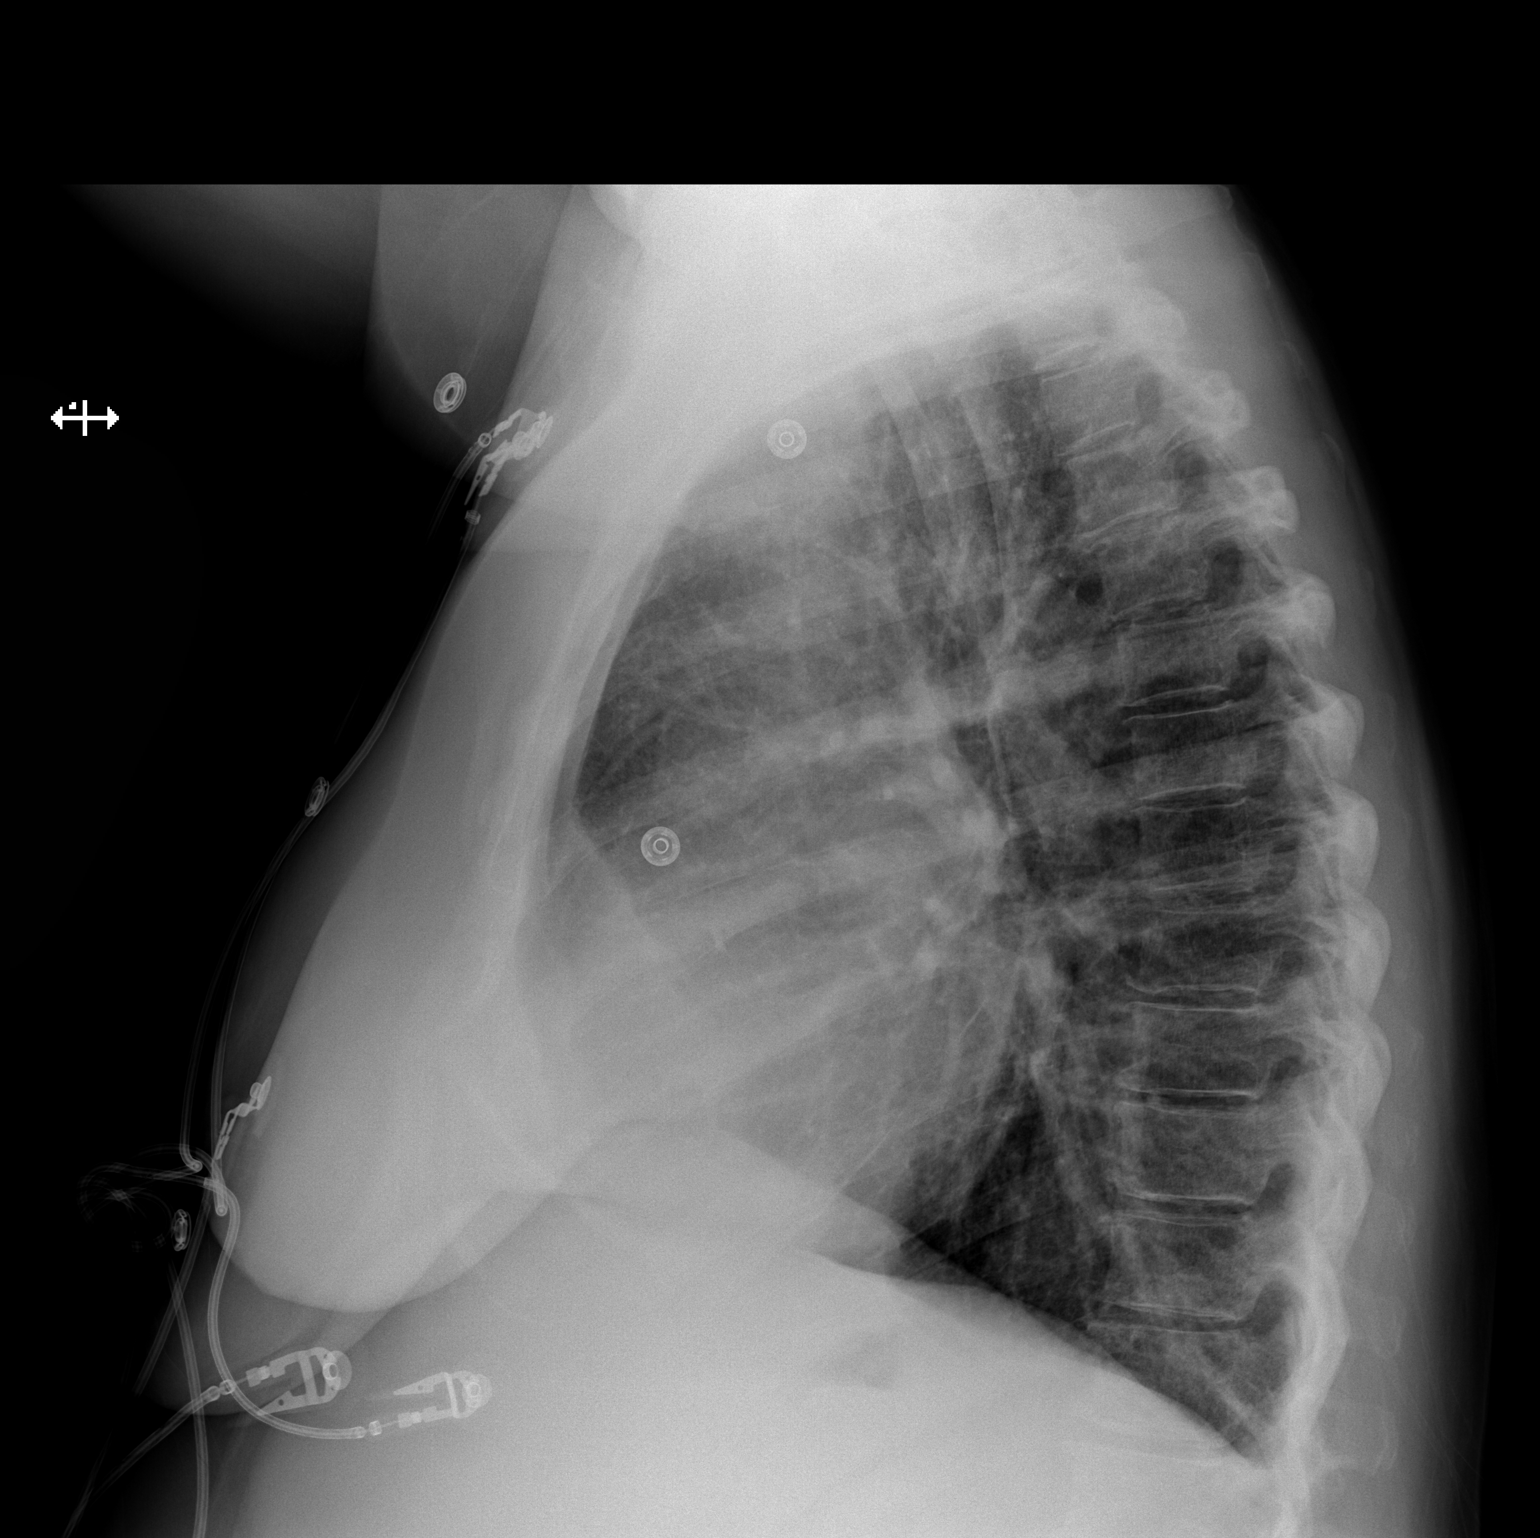

[2 of 2 positions shown; findings below may reference images not displayed]

FINDINGS: Underlying emphysematous changes again noted. There is no
appreciable edema or consolidation. Heart is mildly prominent but
stable. No adenopathy. No bone lesions.
IMPRESSION: Emphysematous change. No edema or consolidation. No change in
cardiac silhouette.

## 2015-03-08 ENCOUNTER — Other Ambulatory Visit: Payer: Self-pay | Admitting: Infectious Diseases

## 2015-03-08 DIAGNOSIS — R921 Mammographic calcification found on diagnostic imaging of breast: Secondary | ICD-10-CM

## 2015-03-08 DIAGNOSIS — Z1239 Encounter for other screening for malignant neoplasm of breast: Secondary | ICD-10-CM

## 2015-03-27 ENCOUNTER — Ambulatory Visit
Admission: RE | Admit: 2015-03-27 | Discharge: 2015-03-27 | Disposition: A | Discharge status: Home / Self Care | Payer: BC Managed Care – PPO | Source: Ambulatory Visit | Attending: Infectious Diseases | Admitting: Infectious Diseases

## 2015-03-27 ENCOUNTER — Other Ambulatory Visit: Payer: Self-pay | Admitting: Infectious Diseases

## 2015-03-27 DIAGNOSIS — R921 Mammographic calcification found on diagnostic imaging of breast: Secondary | ICD-10-CM

## 2015-03-27 DIAGNOSIS — Z1239 Encounter for other screening for malignant neoplasm of breast: Secondary | ICD-10-CM

## 2016-03-11 ENCOUNTER — Other Ambulatory Visit: Payer: Self-pay | Admitting: Infectious Diseases

## 2016-03-11 DIAGNOSIS — R921 Mammographic calcification found on diagnostic imaging of breast: Secondary | ICD-10-CM

## 2016-03-11 DIAGNOSIS — Z1239 Encounter for other screening for malignant neoplasm of breast: Secondary | ICD-10-CM

## 2016-04-17 ENCOUNTER — Ambulatory Visit
Admission: RE | Admit: 2016-04-17 | Discharge: 2016-04-17 | Disposition: A | Discharge status: Home / Self Care | Payer: Medicare Other | Source: Ambulatory Visit | Attending: Infectious Diseases | Admitting: Infectious Diseases

## 2016-04-17 ENCOUNTER — Other Ambulatory Visit: Payer: Self-pay | Admitting: Infectious Diseases

## 2016-04-17 DIAGNOSIS — R921 Mammographic calcification found on diagnostic imaging of breast: Secondary | ICD-10-CM

## 2016-04-17 DIAGNOSIS — Z1239 Encounter for other screening for malignant neoplasm of breast: Secondary | ICD-10-CM

## 2017-04-08 ENCOUNTER — Other Ambulatory Visit: Payer: Self-pay | Admitting: Infectious Diseases

## 2017-04-08 DIAGNOSIS — Z1231 Encounter for screening mammogram for malignant neoplasm of breast: Secondary | ICD-10-CM

## 2017-05-08 ENCOUNTER — Ambulatory Visit
Admission: RE | Admit: 2017-05-08 | Discharge: 2017-05-08 | Disposition: A | Discharge status: Home / Self Care | Payer: Medicare Other | Source: Ambulatory Visit | Attending: Infectious Diseases | Admitting: Infectious Diseases

## 2017-05-08 DIAGNOSIS — Z1231 Encounter for screening mammogram for malignant neoplasm of breast: Secondary | ICD-10-CM | POA: Insufficient documentation

## 2019-02-03 ENCOUNTER — Other Ambulatory Visit: Payer: Self-pay | Admitting: Infectious Diseases

## 2019-02-03 DIAGNOSIS — Z1231 Encounter for screening mammogram for malignant neoplasm of breast: Secondary | ICD-10-CM

## 2019-10-07 ENCOUNTER — Emergency Department: Payer: Medicare PPO

## 2019-10-07 ENCOUNTER — Encounter: Payer: Self-pay | Admitting: Emergency Medicine

## 2019-10-07 ENCOUNTER — Other Ambulatory Visit: Payer: Self-pay

## 2019-10-07 ENCOUNTER — Inpatient Hospital Stay
Admission: EM | Admit: 2019-10-07 | Discharge: 2019-10-09 | DRG: 190 | Disposition: A | Discharge status: Home Health | Payer: Medicare PPO | Attending: Internal Medicine | Admitting: Internal Medicine

## 2019-10-07 DIAGNOSIS — E538 Deficiency of other specified B group vitamins: Secondary | ICD-10-CM | POA: Diagnosis present

## 2019-10-07 DIAGNOSIS — Z9981 Dependence on supplemental oxygen: Secondary | ICD-10-CM

## 2019-10-07 DIAGNOSIS — Z91040 Latex allergy status: Secondary | ICD-10-CM | POA: Diagnosis not present

## 2019-10-07 DIAGNOSIS — M1990 Unspecified osteoarthritis, unspecified site: Secondary | ICD-10-CM | POA: Diagnosis present

## 2019-10-07 DIAGNOSIS — Z87891 Personal history of nicotine dependence: Secondary | ICD-10-CM | POA: Diagnosis not present

## 2019-10-07 DIAGNOSIS — E440 Moderate protein-calorie malnutrition: Secondary | ICD-10-CM | POA: Insufficient documentation

## 2019-10-07 DIAGNOSIS — I482 Chronic atrial fibrillation, unspecified: Secondary | ICD-10-CM | POA: Diagnosis present

## 2019-10-07 DIAGNOSIS — J9621 Acute and chronic respiratory failure with hypoxia: Secondary | ICD-10-CM | POA: Diagnosis present

## 2019-10-07 DIAGNOSIS — Z20822 Contact with and (suspected) exposure to covid-19: Secondary | ICD-10-CM | POA: Diagnosis present

## 2019-10-07 DIAGNOSIS — I10 Essential (primary) hypertension: Secondary | ICD-10-CM

## 2019-10-07 DIAGNOSIS — Z6829 Body mass index (BMI) 29.0-29.9, adult: Secondary | ICD-10-CM

## 2019-10-07 DIAGNOSIS — K219 Gastro-esophageal reflux disease without esophagitis: Secondary | ICD-10-CM | POA: Diagnosis present

## 2019-10-07 DIAGNOSIS — Z803 Family history of malignant neoplasm of breast: Secondary | ICD-10-CM

## 2019-10-07 DIAGNOSIS — J441 Chronic obstructive pulmonary disease with (acute) exacerbation: Principal | ICD-10-CM | POA: Diagnosis present

## 2019-10-07 DIAGNOSIS — Z880 Allergy status to penicillin: Secondary | ICD-10-CM | POA: Diagnosis not present

## 2019-10-07 DIAGNOSIS — R0602 Shortness of breath: Secondary | ICD-10-CM | POA: Diagnosis present

## 2019-10-07 HISTORY — DX: Unspecified atrial fibrillation: I48.91

## 2019-10-07 HISTORY — DX: Unspecified osteoarthritis, unspecified site: M19.90

## 2019-10-07 LAB — BASIC METABOLIC PANEL
Anion gap: 12 (ref 5–15)
BUN: 6 mg/dL — ABNORMAL LOW (ref 8–23)
CO2: 37 mmol/L — ABNORMAL HIGH (ref 22–32)
Calcium: 9.4 mg/dL (ref 8.9–10.3)
Chloride: 90 mmol/L — ABNORMAL LOW (ref 98–111)
Creatinine, Ser: 0.4 mg/dL — ABNORMAL LOW (ref 0.44–1.00)
GFR calc Af Amer: >60 mL/min (ref >60)
GFR calc non Af Amer: >60 mL/min (ref >60)
Glucose, Bld: 103 mg/dL — ABNORMAL HIGH (ref 70–99)
Potassium: 4.3 mmol/L (ref 3.5–5.1)
Sodium: 139 mmol/L (ref 135–145)

## 2019-10-07 LAB — CBC
HCT: 33.1 % — ABNORMAL LOW (ref 36.0–46.0)
Hemoglobin: 10.6 g/dL — ABNORMAL LOW (ref 12.0–15.0)
MCH: 31.9 pg (ref 26.0–34.0)
MCHC: 32 g/dL (ref 30.0–36.0)
MCV: 99.7 fL (ref 80.0–100.0)
Platelets: 380 10*3/uL (ref 150–400)
RBC: 3.32 MIL/uL — ABNORMAL LOW (ref 3.87–5.11)
RDW: 13.8 % (ref 11.5–15.5)
WBC: 6.3 10*3/uL (ref 4.0–10.5)
nRBC: 0 % (ref 0.0–0.2)

## 2019-10-07 LAB — SARS CORONAVIRUS 2 BY RT PCR (HOSPITAL ORDER, PERFORMED IN ~~LOC~~ HOSPITAL LAB): SARS Coronavirus 2: NEGATIVE

## 2019-10-07 MED ORDER — TIOTROPIUM BROMIDE MONOHYDRATE 18 MCG IN CAPS
18.0000 ug | ORAL_CAPSULE | Freq: Every day | RESPIRATORY_TRACT | Status: DC
  Administered 2019-10-08 – 2019-10-09 (×2): 18 ug via RESPIRATORY_TRACT
  Filled 2019-10-07: qty 5

## 2019-10-07 MED ORDER — GUAIFENESIN ER 600 MG PO TB12
600.0000 mg | ORAL_TABLET | Freq: Two times a day (BID) | ORAL | Status: DC
  Administered 2019-10-07 – 2019-10-09 (×4): 600 mg via ORAL
  Filled 2019-10-07 (×4): qty 1

## 2019-10-07 MED ORDER — FLUTICASONE PROPIONATE 50 MCG/ACT NA SUSP
2.0000 | Freq: Every day | NASAL | Status: DC | PRN
  Filled 2019-10-07: qty 16

## 2019-10-07 MED ORDER — ONDANSETRON HCL 4 MG/2ML IJ SOLN: 4.0000 mg | Freq: Four times a day (QID) | INTRAMUSCULAR | Status: DC | PRN

## 2019-10-07 MED ORDER — RIVAROXABAN 20 MG PO TABS
20.0000 mg | ORAL_TABLET | Freq: Every day | ORAL | Status: DC
  Administered 2019-10-08 (×2): 20 mg via ORAL
  Filled 2019-10-07 (×3): qty 1

## 2019-10-07 MED ORDER — SODIUM CHLORIDE 0.9 % IV SOLN
1.0000 g | INTRAVENOUS | Status: DC
  Administered 2019-10-07: 1 g via INTRAVENOUS
  Filled 2019-10-07 (×2): qty 10

## 2019-10-07 MED ORDER — ALPRAZOLAM 0.25 MG PO TABS
0.2500 mg | ORAL_TABLET | Freq: Once | ORAL | Status: AC
  Administered 2019-10-07: 0.25 mg via ORAL
  Filled 2019-10-07: qty 1

## 2019-10-07 MED ORDER — HYDROCOD POLST-CPM POLST ER 10-8 MG/5ML PO SUER: 5.0000 mL | Freq: Two times a day (BID) | ORAL | Status: DC | PRN

## 2019-10-07 MED ORDER — FERROUS SULFATE 325 (65 FE) MG PO TABS
325.0000 mg | ORAL_TABLET | Freq: Every day | ORAL | Status: DC
  Administered 2019-10-08 – 2019-10-09 (×2): 325 mg via ORAL
  Filled 2019-10-07 (×2): qty 1

## 2019-10-07 MED ORDER — ALBUTEROL SULFATE (2.5 MG/3ML) 0.083% IN NEBU
5.0000 mg | INHALATION_SOLUTION | Freq: Once | RESPIRATORY_TRACT | Status: DC
  Filled 2019-10-07: qty 6

## 2019-10-07 MED ORDER — IPRATROPIUM-ALBUTEROL 0.5-2.5 (3) MG/3ML IN SOLN
3.0000 mL | Freq: Once | RESPIRATORY_TRACT | Status: AC
  Administered 2019-10-07: 3 mL via RESPIRATORY_TRACT
  Filled 2019-10-07: qty 3

## 2019-10-07 MED ORDER — MAGNESIUM HYDROXIDE 400 MG/5ML PO SUSP: 30.0000 mL | Freq: Every day | ORAL | Status: DC | PRN

## 2019-10-07 MED ORDER — MELATONIN 5 MG PO TABS
5.0000 mg | ORAL_TABLET | Freq: Every evening | ORAL | Status: DC | PRN
  Administered 2019-10-08 (×2): 5 mg via ORAL
  Filled 2019-10-07 (×2): qty 1

## 2019-10-07 MED ORDER — ONDANSETRON HCL 4 MG PO TABS: 4.0000 mg | ORAL_TABLET | Freq: Four times a day (QID) | ORAL | Status: DC | PRN

## 2019-10-07 MED ORDER — ALPRAZOLAM 0.25 MG PO TABS
0.2500 mg | ORAL_TABLET | Freq: Two times a day (BID) | ORAL | Status: DC | PRN
  Administered 2019-10-08 – 2019-10-09 (×3): 0.25 mg via ORAL
  Filled 2019-10-07 (×3): qty 1

## 2019-10-07 MED ORDER — PANTOPRAZOLE SODIUM 40 MG PO TBEC
40.0000 mg | DELAYED_RELEASE_TABLET | Freq: Every day | ORAL | Status: DC
  Administered 2019-10-08 – 2019-10-09 (×2): 40 mg via ORAL
  Filled 2019-10-07 (×2): qty 1

## 2019-10-07 MED ORDER — METHYLPREDNISOLONE SODIUM SUCC 125 MG IJ SOLR
125.0000 mg | Freq: Once | INTRAMUSCULAR | Status: AC
  Administered 2019-10-07: 125 mg via INTRAVENOUS
  Filled 2019-10-07: qty 2

## 2019-10-07 MED ORDER — SODIUM CHLORIDE 0.9 % IV SOLN
500.0000 mg | INTRAVENOUS | Status: DC
  Administered 2019-10-08 (×2): 500 mg via INTRAVENOUS
  Filled 2019-10-07 (×3): qty 500

## 2019-10-07 MED ORDER — ENOXAPARIN SODIUM 40 MG/0.4ML ~~LOC~~ SOLN
40.0000 mg | SUBCUTANEOUS | Status: DC
  Administered 2019-10-07: 40 mg via SUBCUTANEOUS
  Filled 2019-10-07: qty 0.4

## 2019-10-07 MED ORDER — ACETAMINOPHEN 650 MG RE SUPP: 650.0000 mg | Freq: Four times a day (QID) | RECTAL | Status: DC | PRN

## 2019-10-07 MED ORDER — IPRATROPIUM-ALBUTEROL 0.5-2.5 (3) MG/3ML IN SOLN
3.0000 mL | Freq: Four times a day (QID) | RESPIRATORY_TRACT | Status: DC
  Administered 2019-10-07 – 2019-10-09 (×7): 3 mL via RESPIRATORY_TRACT
  Filled 2019-10-07 (×6): qty 3

## 2019-10-07 MED ORDER — SODIUM CHLORIDE 0.9 % IV SOLN: INTRAVENOUS | Status: DC

## 2019-10-07 MED ORDER — POTASSIUM CHLORIDE CRYS ER 20 MEQ PO TBCR
20.0000 meq | EXTENDED_RELEASE_TABLET | Freq: Every day | ORAL | Status: DC
  Administered 2019-10-08 – 2019-10-09 (×2): 20 meq via ORAL
  Filled 2019-10-07 (×2): qty 1

## 2019-10-07 MED ORDER — ACETAMINOPHEN 325 MG PO TABS
650.0000 mg | ORAL_TABLET | Freq: Four times a day (QID) | ORAL | Status: DC | PRN
  Administered 2019-10-07 – 2019-10-08 (×2): 650 mg via ORAL
  Filled 2019-10-07 (×2): qty 2

## 2019-10-07 MED ORDER — TRAZODONE HCL 50 MG PO TABS: 25.0000 mg | ORAL_TABLET | Freq: Every evening | ORAL | Status: DC | PRN

## 2019-10-07 MED ORDER — LORATADINE 10 MG PO TABS
10.0000 mg | ORAL_TABLET | Freq: Every day | ORAL | Status: DC
  Administered 2019-10-08 – 2019-10-09 (×2): 10 mg via ORAL
  Filled 2019-10-07 (×2): qty 1

## 2019-10-07 MED ORDER — CYANOCOBALAMIN 1000 MCG/ML IJ SOLN: 1000.0000 ug | INTRAMUSCULAR | Status: DC

## 2019-10-07 MED ORDER — TRAMADOL HCL 50 MG PO TABS
50.0000 mg | ORAL_TABLET | Freq: Four times a day (QID) | ORAL | Status: DC | PRN
  Administered 2019-10-08 – 2019-10-09 (×6): 50 mg via ORAL
  Filled 2019-10-07 (×6): qty 1

## 2019-10-07 MED ORDER — DICLOFENAC SODIUM 1 % EX GEL
2.0000 g | Freq: Two times a day (BID) | CUTANEOUS | Status: DC | PRN
  Administered 2019-10-08 – 2019-10-09 (×2): 2 g via TOPICAL
  Filled 2019-10-07 (×2): qty 100

## 2019-10-07 MED ORDER — DILTIAZEM HCL ER COATED BEADS 300 MG PO CP24
300.0000 mg | ORAL_CAPSULE | Freq: Every day | ORAL | Status: DC
  Administered 2019-10-08 – 2019-10-09 (×2): 300 mg via ORAL
  Filled 2019-10-07 (×3): qty 1

## 2019-10-07 MED ORDER — FUROSEMIDE 20 MG PO TABS
20.0000 mg | ORAL_TABLET | Freq: Two times a day (BID) | ORAL | Status: DC
  Administered 2019-10-08 – 2019-10-09 (×2): 20 mg via ORAL
  Filled 2019-10-07 (×3): qty 1

## 2019-10-07 NOTE — ED Notes (Signed)
Pt updated in [facility]

## 2019-10-07 NOTE — ED Provider Notes (Signed)
[facility] Emergency Department Provider Note  ____________________________________________   I have reviewed the triage vital signs and the nursing notes.   HISTORY  Chief Complaint Shortness of Breath   History limited by: Not Limited   HPI Julie Escobar is a 69 y.o. female who presents to the emergency department today with primary concerns for shortness of breath.  The patient states that she has been feeling unwell for the past few days.  She did have a hard time describing initially how she fell but then she started developing some shortness of breath.  She denies any accompanying chest pain.  She has a chronic cough and states this has not changed.  The patient denies any fevers.  Denies any known sick contacts.  Patient is on oxygen at baseline.  She has history of COPD and states she has had to be put on steroids and zpak in the past.   Records reviewed. Per medical record review patient has a history of COPD, on 4L O2 at baseline.   Past Medical History:  Diagnosis Date  . Atrial fibrillation (HCC)   . Osteoarthritis     There are no problems to display for this patient.   History reviewed. No pertinent surgical history.  Prior to Admission medications   Not on File    Allergies Other and Penicillins  Family History  Problem Relation Age of Onset  . Breast cancer Maternal Aunt 27    Social History Social History   Tobacco Use  . Smoking status: Not on file  Substance Use Topics  . Alcohol use: Not on file  . Drug use: Not on file    Review of Systems Constitutional: No fever/chills Eyes: No visual changes. ENT: No sore throat. Cardiovascular: Denies chest pain. Respiratory: Positive for shortness of breath. Gastrointestinal: No abdominal pain.  No nausea, no vomiting.  No diarrhea.   Genitourinary: Negative for dysuria. Musculoskeletal: Negative for back pain. Skin: Negative for rash. Neurological: Negative for  headaches, focal weakness or numbness.  ____________________________________________   PHYSICAL EXAM:  VITAL SIGNS: ED Triage Vitals  Enc Vitals Group     BP 10/07/19 1032 (!) 143/66     Pulse Rate 10/07/19 1032 100     Resp 10/07/19 1032 (!) 22     Temp 10/07/19 1032 98.6 F (37 C)     Temp Source 10/07/19 1032 Oral     SpO2 10/07/19 1032 100 %     Weight 10/07/19 1032 158 lb (71.7 kg)     Height 10/07/19 1032 5\' 1"  (1.549 m)     Head Circumference --      Peak Flow --      Pain Score 10/07/19 1025 8   Constitutional: Alert and oriented.  Eyes: Conjunctivae are normal.  ENT      Head: Normocephalic and atraumatic.      Nose: No congestion/rhinnorhea.      Mouth/Throat: Mucous membranes are moist.      Neck: No stridor. Hematological/Lymphatic/Immunilogical: No cervical lymphadenopathy. Cardiovascular: Normal rate, regular rhythm.  No murmurs, rubs, or gallops.  Respiratory: Slightly increased respiratory effort. Some diffuse expiratory wheezing. Gastrointestinal: Soft and non tender. No rebound. No guarding.  Genitourinary: Deferred Musculoskeletal: Normal range of motion in all extremities. No lower extremity edema. Neurologic:  Normal speech and language. No gross focal neurologic deficits are appreciated.  Skin:  Skin is warm, dry and intact. No rash noted. Psychiatric: Mood and affect are normal. Speech and behavior are normal.  Patient exhibits appropriate insight and judgment.  ____________________________________________    LABS (pertinent positives/negatives)  CBC wbc 6.3, hgb 10.6, plt 380 BMP na 139, k 4.3, glu 103, cr 0.40  ____________________________________________   EKG  I, Nance Pear, attending physician, personally viewed and interpreted this EKG  EKG Time: 1030 Rate: 88 Rhythm: atrial fibrillation Axis: normal Intervals: qtc 411 QRS: narrow, q waves v1, v2 ST changes: no st elevation Impression: abnormal  ekg  ____________________________________________    RADIOLOGY  CXR Linear lucency, likely skin fold vs pneumothorax. No other acute abnormality  ____________________________________________   PROCEDURES  Procedures  ____________________________________________   INITIAL IMPRESSION / ASSESSMENT AND PLAN / ED COURSE  Pertinent labs & imaging results that were available during my care of the patient were reviewed by me and considered in my medical decision making (see chart for details).   Patient presented to the emergency department today with concern for shortness of breath.  Patient has a history of COPD.  Patient was given Solu-Medrol and DuoNeb treatments here in the emergency department.  Chest x-ray read said that there could be some concern for pneumothorax.  However I think this is highly unlikely given the patient not complaining of any pain.  Patient did continue to have some shortness of breath after breathing treatments and steroids.  Will plan on admission to the hospitalist service.  ____________________________________________   FINAL CLINICAL IMPRESSION(S) / ED DIAGNOSES  Final diagnoses:  COPD exacerbation ([place])     Note: This dictation was prepared with Dragon dictation. Any transcriptional errors that result from this process are unintentional     Nance Pear, MD 10/07/19 2003

## 2019-10-07 NOTE — H&P (Signed)
[place] at [facility]   PATIENT NAME: Julie Escobar    MR#:  [id number]  DATE OF BIRTH:  1951-01-24  DATE OF ADMISSION:  10/07/2019  PRIMARY CARE PHYSICIAN: Mick Sell, MD   REQUESTING/REFERRING PHYSICIAN: Phineas Semen, MD  CHIEF COMPLAINT:   Chief Complaint  Patient presents with  . Shortness of Breath    HISTORY OF PRESENT ILLNESS:  Shefali Ng  is a 69 y.o. Caucasian female with a known history of atrial fibrillation, osteoarthritis and COPD, who presented to the emergency room with acute onset of worsening dyspnea with generalized weakness with associated cough which has been mainly dry and wheezing that are not significantly worse from her baseline.  She has been requiring more oxygen with ambulation in the ER.  She denies any fever or chills.  No nausea or vomiting or abdominal pain.  No chest pain or palpitations.  She was complaining of mild bilateral knee pain and anxiety.  No dysuria clinically or hematuria or flank pain.  Protection  Upon presentation to the emergency room, blood pressure was 154/80 with a pulse of 102 respiratory to 22 and pulse symmetry was 99% on 4 L of O2 by nasal cannula.  Labs revealed anemia on her CBC that is lower than levels in 2015 and her BMP was unremarkable.  Two-view chest x-ray showed no acute cardiopulmonary abnormality and chronic elevation of the right hemidiaphragm with additional features compatible with COPD.EKG showed atrial fibrillation with controlled ventricular response of 88.  The patient was given 3 duo nebs, nebulized albuterol and IV Solu-Medrol.  She will be admitted to a medical monitored bed for further evaluation and management.   PAST MEDICAL HISTORY:   Past Medical History:  Diagnosis Date  . Atrial fibrillation (HCC)   . Osteoarthritis   COPD, tobacco abuse, hypertension, GERD,  PAST SURGICAL HISTORY:  C-section, hysterectomy, colon resection for diverticulitis, repair of the vaginal  fistula  SOCIAL HISTORY:   Social History   Tobacco Use  . Smoking status: Not on file  Substance Use Topics  . Alcohol use: Not on file  Positive history of tobacco abuse.  She drinks alcohol occasionally.  No history of illicit drug use. FAMILY HISTORY:   Family History  Problem Relation Age of Onset  . Breast cancer Maternal Aunt 82    DRUG ALLERGIES:   Allergies  Allergen Reactions  . Other Itching and Rash    Latex  . Penicillins Rash    REVIEW OF SYSTEMS:   ROS As per history of present illness. All pertinent systems were reviewed above. Constitutional,  HEENT, cardiovascular, respiratory, GI, GU, musculoskeletal, neuro, psychiatric, endocrine,  integumentary and hematologic systems were reviewed and are otherwise  negative/unremarkable except for positive findings mentioned above in the HPI.   MEDICATIONS AT HOME:   Prior to Admission medications   Not on File      VITAL SIGNS:  Blood pressure (!) 154/80, pulse 95, temperature 97.9 F (36.6 C), temperature source Oral, resp. rate (!) 28, height 5\' 1"  (1.549 m), weight 71.7 kg, SpO2 99 %.  PHYSICAL EXAMINATION:  Physical Exam  GENERAL:  69 y.o.-year-old patient lying in the bed with mild respiratory distress with conversational dyspnea. EYES: Pupils equal, round, reactive to light and accommodation. No scleral icterus. Extraocular muscles intact.  HEENT: Head atraumatic, normocephalic. Oropharynx and nasopharynx clear.  NECK:  Supple, no jugular venous distention. No thyroid enlargement, no tenderness.  LUNGS: Diffuse expiratory wheezes with tight expiratory airflow and  harsh vesicular breathing. CARDIOVASCULAR: Regular rate and rhythm, S1, S2 normal. No murmurs, rubs, or gallops.  ABDOMEN: Soft, nondistended, nontender. Bowel sounds present. No organomegaly or mass.  EXTREMITIES: Trace bilateral lower extremity pitting edema, with no cyanosis, or clubbing.  NEUROLOGIC: Cranial nerves II through XII are  intact. Muscle strength 5/5 in all extremities. Sensation intact. Gait not checked.  PSYCHIATRIC: The patient is alert and oriented x 3.  Normal affect and good eye contact. SKIN: No obvious rash, lesion, or ulcer.   LABORATORY PANEL:   CBC Recent Labs  Lab 10/07/19 1032  WBC 6.3  HGB 10.6*  HCT 33.1*  PLT 380   ------------------------------------------------------------------------------------------------------------------  Chemistries  Recent Labs  Lab 10/07/19 1415  NA 139  K 4.3  CL 90*  CO2 37*  GLUCOSE 103*  BUN 6*  CREATININE 0.40*  CALCIUM 9.4   ------------------------------------------------------------------------------------------------------------------  Cardiac Enzymes No results for input(s): TROPONINI in the last 168 hours. ------------------------------------------------------------------------------------------------------------------  RADIOLOGY:  DG Chest 2 View  Result Date: 10/07/2019 CLINICAL DATA:  Shortness of breath for 4 days, history of COPD EXAM: CHEST - 2 VIEW COMPARISON:  Radiograph 09/16/2013 FINDINGS: Chronic elevation of the right hemidiaphragm is similar to prior. There is a diffusely hyperinflated appearance of the lungs with flattening of the diaphragms. A linear lucency along the right lung periphery is favored to reflect a skin fold given visualization of vessels and fissures beyond this line and lack of continuation. No convincing pneumothorax or effusion. No consolidation features of edema. Slight interstitial coarsening towards the bases with apical lucency may reflect emphysematous change. Atherosclerotic plaque within the normal caliber aorta. No acute osseous or soft tissue abnormality. Degenerative changes are present in the imaged spine and shoulders. IMPRESSION: 1. Linear lucency along the right lung periphery is favored to reflect a skin fold rather than pneumothorax. Could consider repositioning and reimaging if there is  clinical ambiguity. 2. No acute cardiopulmonary abnormality. 3. Chronic elevation of the right hemidiaphragm. Additional features compatible with COPD. 4.  Aortic Atherosclerosis (ICD10-I70.0). Electronically Signed   By: Lovena Le M.D.   On: 10/07/2019 16:00      IMPRESSION AND PLAN:   1.  COPD acute exacerbation with mild acute on chronic hypoxic respiratory failure.. -The patient will be admitted to medical monitored bed. -We will continue steroid therapy with IV Solu-Medrol. -We will continue nebulized bronchodilator therapy 4 times daily and every 4 hours as needed's with duo nebs. -We will continue her Spiriva. -We will place her on IV Rocephin and Zithromax as well as mucolytic therapy. -O2 protocol will be followed.  2.  Essential hypertension. -Cardizem CD will be continued.  3.  GERD without esophagitis. -PPI therapy will be resumed.  4.  Vitamin B12 deficiency. -We will continue her vitamin B12.  5.  Chronic atrial fibrillation with controlled ventricular response. -We will continue her Xarelto and Cardizem CD.  6.  DVT prophylaxis. -We will continue Xarelto.   All the records are reviewed and case discussed with ED provider. The plan of care was discussed in details with the patient (and family). I answered all questions. The patient agreed to proceed with the above mentioned plan. Further management will depend upon hospital course.   CODE STATUS: Full code  Status is: Inpatient  Remains inpatient appropriate because:Ongoing diagnostic testing needed not appropriate for outpatient work up, Unsafe d/c plan, IV treatments appropriate due to intensity of illness or inability to take PO and Inpatient level of care appropriate due to severity  of illness   Dispo: The patient is from: Home              Anticipated d/c is to: Home              Anticipated d/c date is: 2 days              Patient currently is not medically stable to d/c.   TOTAL TIME TAKING  CARE OF THIS PATIENT: 55 minutes.    Hannah Beat M.D on 10/07/2019 at 7:56 PM  [facility]   From 7 PM-7 AM, contact night-coverage [email or web address]  CC: Primary care physician; Mick Sell, MD   Note: This dictation was prepared with Dragon dictation along with smaller phrase technology. Any transcriptional typo errors that result from this process are unintentional.

## 2019-10-07 NOTE — ED Triage Notes (Signed)
Patient presents to the ED via EMS from home for increasing shortness of breath since Monday.  Patient is on 4L/[place] at baseline.  Patient is requesting a breathing treatment.  Patient is speaking in full sentences, appears to have some retractions and appears uncomfortable. Patient has history of COPD and AFIB.

## 2019-10-07 NOTE — ED Notes (Signed)
Snacks and drink provided per pt request.

## 2019-10-08 ENCOUNTER — Encounter: Payer: Self-pay | Admitting: Family Medicine

## 2019-10-08 DIAGNOSIS — E440 Moderate protein-calorie malnutrition: Secondary | ICD-10-CM | POA: Insufficient documentation

## 2019-10-08 DIAGNOSIS — J441 Chronic obstructive pulmonary disease with (acute) exacerbation: Principal | ICD-10-CM

## 2019-10-08 LAB — BASIC METABOLIC PANEL
Anion gap: 5 (ref 5–15)
BUN: 6 mg/dL — ABNORMAL LOW (ref 8–23)
CO2: 43 mmol/L — ABNORMAL HIGH (ref 22–32)
Calcium: 9.1 mg/dL (ref 8.9–10.3)
Chloride: 92 mmol/L — ABNORMAL LOW (ref 98–111)
Creatinine, Ser: 0.39 mg/dL — ABNORMAL LOW (ref 0.44–1.00)
GFR calc Af Amer: >60 mL/min (ref >60)
GFR calc non Af Amer: >60 mL/min (ref >60)
Glucose, Bld: 148 mg/dL — ABNORMAL HIGH (ref 70–99)
Potassium: 3.7 mmol/L (ref 3.5–5.1)
Sodium: 140 mmol/L (ref 135–145)

## 2019-10-08 LAB — CBC
HCT: 34.3 % — ABNORMAL LOW (ref 36.0–46.0)
Hemoglobin: 10.7 g/dL — ABNORMAL LOW (ref 12.0–15.0)
MCH: 31.8 pg (ref 26.0–34.0)
MCHC: 31.2 g/dL (ref 30.0–36.0)
MCV: 101.8 fL — ABNORMAL HIGH (ref 80.0–100.0)
Platelets: 368 10*3/uL (ref 150–400)
RBC: 3.37 MIL/uL — ABNORMAL LOW (ref 3.87–5.11)
RDW: 13.4 % (ref 11.5–15.5)
WBC: 3.6 10*3/uL — ABNORMAL LOW (ref 4.0–10.5)
nRBC: 0 % (ref 0.0–0.2)

## 2019-10-08 LAB — HIV ANTIBODY (ROUTINE TESTING W REFLEX): HIV Screen 4th Generation wRfx: NONREACTIVE

## 2019-10-08 MED ORDER — IPRATROPIUM-ALBUTEROL 0.5-2.5 (3) MG/3ML IN SOLN
3.0000 mL | RESPIRATORY_TRACT | Status: DC | PRN
  Administered 2019-10-08: 3 mL via RESPIRATORY_TRACT
  Filled 2019-10-08: qty 3

## 2019-10-08 MED ORDER — PREDNISONE 20 MG PO TABS
40.0000 mg | ORAL_TABLET | Freq: Every day | ORAL | Status: DC
  Administered 2019-10-09: 40 mg via ORAL
  Filled 2019-10-08: qty 2

## 2019-10-08 MED ORDER — ENSURE ENLIVE PO LIQD
237.0000 mL | ORAL | Status: DC
  Administered 2019-10-09: 237 mL via ORAL

## 2019-10-08 MED ORDER — MOMETASONE FURO-FORMOTEROL FUM 200-5 MCG/ACT IN AERO
2.0000 | INHALATION_SPRAY | Freq: Two times a day (BID) | RESPIRATORY_TRACT | Status: DC
  Administered 2019-10-08 – 2019-10-09 (×3): 2 via RESPIRATORY_TRACT
  Filled 2019-10-08: qty 8.8

## 2019-10-08 NOTE — Progress Notes (Signed)
PROGRESS NOTE    Julie Escobar  GYI:[id number] DOB: 01-05-1951 DOA: 10/07/2019 PCP: Mick Sell, MD   Brief Narrative:  Julie Escobar  is a 69 y.o. Caucasian female with a known history of atrial fibrillation, osteoarthritis and COPD, who presented to the emergency room with acute onset of worsening dyspnea with generalized weakness with associated cough which has been mainly dry and wheezing that are not significantly worse from her baseline.  She has been requiring more oxygen with ambulation in the ER. Admitted for COPD exacerbation.  Subjective: Patient was feeling little weak when seen today.  She was saturating well on her base home oxygen of 4 L.  Denies any more shortness of breath.  Assessment & Plan:   Active Problems:   COPD exacerbation (HCC)  COPD acute exacerbation.  Patient appears back to her baseline.  She was saturating well on her home oxygen of 4 L.  Continue to feel little weak as compared to her baseline and would like to stay 1 more night before returning home as she lives alone. -Start her on prednisone 40 mg daily for 4 more days. -Continue Zithromax. -Discontinue ceftriaxone as patient is afebrile, no leukocytosis and no radiologic evidence of pulmonary infiltrate. -Continue duo nebs. -Continue home inhalers. -Continue supplemental oxygen.  Essential hypertension. -Continue home dose of Cardizem.  GERD without esophagitis. -Continue PPI.  Vitamin B12 deficiency. -Continue home dose of vitamin B12 supplement.  Chronic atrial fibrillation with controlled ventricular response. -Continue home dose of Xarelto and Cardizem.  Objective: Vitals:   10/08/19 0523 10/08/19 0740 10/08/19 0812 10/08/19 1100  BP: (!) 142/76 (!) 141/80    Pulse: 66 85    Resp: 16 17    Temp: 98 F (36.7 C) 97.6 F (36.4 C)    TempSrc: Oral Oral    SpO2: 100% 100% 96% 96%  Weight:      Height:        Intake/Output Summary (Last 24 hours) at 10/08/2019 1358 Last  data filed at 10/08/2019 0523 Gross per 24 hour  Intake 707.78 ml  Output 0 ml  Net 707.78 ml   Filed Weights   10/07/19 1032  Weight: 71.7 kg    Examination:  General exam: Appears calm and comfortable  Respiratory system: Clear to auscultation. Respiratory effort normal. Cardiovascular system: S1 & S2 heard, RRR. No JVD, murmurs, rubs,  Gastrointestinal system: Soft, nontender, nondistended, bowel sounds positive. Central nervous system: Alert and oriented. No focal neurological deficits.Symmetric 5 x 5 power. Extremities: No edema, no cyanosis, pulses intact and symmetrical. Psychiatry: Judgement and insight appear normal. Mood & affect appropriate.    DVT prophylaxis: Xarelto Code Status: Full Family Communication: Daughter was updated on phone. Disposition Plan:  Status is: Inpatient  Remains inpatient appropriate because:Inpatient level of care appropriate due to severity of illness   Dispo: The patient is from: Home              Anticipated d/c is to: Home              Anticipated d/c date is: 1 day              Patient currently is not medically stable to d/c.  Consultants:   None  Procedures:  Antimicrobials:  Zithromax  Data Reviewed: I have personally reviewed following labs and imaging studies  CBC: Recent Labs  Lab 10/07/19 1032 10/08/19 0548  WBC 6.3 3.6*  HGB 10.6* 10.7*  HCT 33.1* 34.3*  MCV 99.7 101.8*  PLT 380 235   Basic Metabolic Panel: Recent Labs  Lab 10/07/19 1415 10/08/19 0548  NA 139 140  K 4.3 3.7  CL 90* 92*  CO2 37* 43*  GLUCOSE 103* 148*  BUN 6* 6*  CREATININE 0.40* 0.39*  CALCIUM 9.4 9.1   GFR: Estimated Creatinine Clearance: 61 mL/min (A) (by C-G formula based on SCr of 0.39 mg/dL (L)). Liver Function Tests: No results for input(s): AST, ALT, ALKPHOS, BILITOT, PROT, ALBUMIN in the last 168 hours. No results for input(s): LIPASE, AMYLASE in the last 168 hours. No results for input(s): AMMONIA in the last 168  hours. Coagulation Profile: No results for input(s): INR, PROTIME in the last 168 hours. Cardiac Enzymes: No results for input(s): CKTOTAL, CKMB, CKMBINDEX, TROPONINI in the last 168 hours. BNP (last 3 results) No results for input(s): PROBNP in the last 8760 hours. HbA1C: No results for input(s): HGBA1C in the last 72 hours. CBG: No results for input(s): GLUCAP in the last 168 hours. Lipid Profile: No results for input(s): CHOL, HDL, LDLCALC, TRIG, CHOLHDL, LDLDIRECT in the last 72 hours. Thyroid Function Tests: No results for input(s): TSH, T4TOTAL, FREET4, T3FREE, THYROIDAB in the last 72 hours. Anemia Panel: No results for input(s): VITAMINB12, FOLATE, FERRITIN, TIBC, IRON, RETICCTPCT in the last 72 hours. Sepsis Labs: No results for input(s): PROCALCITON, LATICACIDVEN in the last 168 hours.  Recent Results (from the past 240 hour(s))  SARS Coronavirus 2 by RT PCR (hospital order, performed in [facility] hospital lab) Nasopharyngeal Nasopharyngeal Swab     Status: None   Collection Time: 10/07/19  7:53 PM   Specimen: Nasopharyngeal Swab  Result Value Ref Range Status   SARS Coronavirus 2 NEGATIVE NEGATIVE Final    Comment: (NOTE) SARS-CoV-2 target nucleic acids are NOT DETECTED.  The SARS-CoV-2 RNA is generally detectable in upper and lower respiratory specimens during the acute phase of infection. The lowest concentration of SARS-CoV-2 viral copies this assay can detect is 250 copies / mL. A negative result does not preclude SARS-CoV-2 infection and should not be used as the sole basis for treatment or other patient management decisions.  A negative result may occur with improper specimen collection / handling, submission of specimen other than nasopharyngeal swab, presence of viral mutation(s) within the areas targeted by this assay, and inadequate number of viral copies (<250 copies / mL). A negative result must be combined with clinical observations, patient history,  and epidemiological information.  Fact Sheet for Patients:   StrictlyIdeas.no  Fact Sheet for Healthcare Providers: BankingDealers.co.za  This test is not yet approved or  cleared by the [place] FDA and has been authorized for detection and/or diagnosis of SARS-CoV-2 by FDA under an Emergency Use Authorization (EUA).  This EUA will remain in effect (meaning this test can be used) for the duration of the COVID-19 declaration under Section 564(b)(1) of the Act, 21 U.S.C. section 360bbb-3(b)(1), unless the authorization is terminated or revoked sooner.  Performed at [facility], [address] [place], [place] [address]      Radiology Studies: DG Chest 2 View  Result Date: 10/07/2019 CLINICAL DATA:  Shortness of breath for 4 days, history of COPD EXAM: CHEST - 2 VIEW COMPARISON:  Radiograph 09/16/2013 FINDINGS: Chronic elevation of the right hemidiaphragm is similar to prior. There is a diffusely hyperinflated appearance of the lungs with flattening of the diaphragms. A linear lucency along the right lung periphery is favored to reflect a skin fold given visualization of vessels and  fissures beyond this line and lack of continuation. No convincing pneumothorax or effusion. No consolidation features of edema. Slight interstitial coarsening towards the bases with apical lucency may reflect emphysematous change. Atherosclerotic plaque within the normal caliber aorta. No acute osseous or soft tissue abnormality. Degenerative changes are present in the imaged spine and shoulders. IMPRESSION: 1. Linear lucency along the right lung periphery is favored to reflect a skin fold rather than pneumothorax. Could consider repositioning and reimaging if there is clinical ambiguity. 2. No acute cardiopulmonary abnormality. 3. Chronic elevation of the right hemidiaphragm. Additional features compatible with COPD. 4.  Aortic Atherosclerosis  (ICD10-I70.0). Electronically Signed   By: Kreg Shropshire M.D.   On: 10/07/2019 16:00    Scheduled Meds: . albuterol  5 mg Nebulization Once  . [START ON 10/11/2019] cyanocobalamin  1,000 mcg Intramuscular Q30 days  . diltiazem  300 mg Oral Daily  . ferrous sulfate  325 mg Oral Q breakfast  . furosemide  20 mg Oral BID  . guaiFENesin  600 mg Oral BID  . ipratropium-albuterol  3 mL Nebulization QID  . loratadine  10 mg Oral Daily  . mometasone-formoterol  2 puff Inhalation BID  . pantoprazole  40 mg Oral Daily  . potassium chloride SA  20 mEq Oral Daily  . rivaroxaban  20 mg Oral Q supper  . tiotropium  18 mcg Inhalation Daily   Continuous Infusions: . azithromycin Stopped (10/08/19 0201)  . cefTRIAXone (ROCEPHIN)  IV 1 g (10/07/19 2308)     LOS: 1 day   Time spent: 40 minutes.  Arnetha Courser, MD [facility]  If 7PM-7AM, please contact night-coverage [email or web address]  10/08/2019, 1:58 PM   This record has been created using Conservation officer, historic buildings. Errors have been sought and corrected,but may not always be located. Such creation errors do not reflect on the standard of care.

## 2019-10-08 NOTE — Progress Notes (Signed)
Initial Nutrition Assessment  DOCUMENTATION CODES:   Non-severe (moderate) malnutrition in context of chronic illness  INTERVENTION:  Recommend liberalizing diet to regular.  Provide Ensure Enlive po once daily, each supplement provides 350 kcal and 20 grams of protein.   Encouraged adequate intake of calories and protein at meals.  NUTRITION DIAGNOSIS:   Moderate Malnutrition related to chronic illness (COPD) as evidenced by mild fat depletion, mild muscle depletion, moderate muscle depletion.  GOAL:   Patient will meet greater than or equal to 90% of their needs  MONITOR:   PO intake, Supplement acceptance, Labs, Weight trends, I & O's  REASON FOR ASSESSMENT:   Consult Assessment of nutrition requirement/status  ASSESSMENT:   69 year old female with PMHx of COPD, A-fib, osteoarthritis, HTN, GERD, vitamin B12 deficiency admitted with acute exacerbation of COPD.   Met with patient and her two daughters at bedside. Patient reports her appetite has been decreased for about 3 days but is improving today. For several days PTA she could only tolerate eating some yogurt. Patient reports she typically eats well and has 3 meals per day. For breakfast she may have toast with orange juice. For lunch she has [place] yogurt, egg, and tuna. For dinner she may have 1/2 sandwich or hotdog, soup, or a meat with sides. She reports over the last 5 years she has been cutting back on portions for intentional weight loss. Discussed nutrition status and loss of muscle mass. Discussed increased calorie/protein needs related to catabolic nature of COPD. Encouraged patient to eat adequate calories and protein at meals. She is amenable to drinking Ensure once daily to help meet calorie/protein needs. Discussed other options she could try at home and answered questions patient had about diet. Patient reports she ate well at lunch today and had chicken, salad, broccoli, mac and cheese, and fruit.  Patient  reports her UBW was 215 lbs. She reports the weight loss occurred slowly over 5 years and she has been weight-stable for the past year.  Medications reviewed and include: vitamin B12 1000 micrograms every 30 days IM, ferrous sulfate 325 mg daily with breakfast, Lasix 20 mg BID, pantoprazole, potassium chloride 20 mEq daily, prednisone 40 mg daily with breakfast, Xarelto, azithromycin.  Labs reviewed: Chloride 92, CO2 43, BUN 6, Creatinine 0.39.  NUTRITION - FOCUSED PHYSICAL EXAM:    Most Recent Value  Orbital Region Mild depletion  Upper Arm Region Moderate depletion  Thoracic and Lumbar Region Mild depletion  Buccal Region Mild depletion  Temple Region Mild depletion  Clavicle Bone Region Moderate depletion  Clavicle and Acromion Bone Region Mild depletion  Scapular Bone Region Mild depletion  Dorsal Hand Moderate depletion  Patellar Region Moderate depletion  Anterior Thigh Region Moderate depletion  Posterior Calf Region Moderate depletion  Edema (RD Assessment) Mild  Hair Reviewed  Eyes Reviewed  Mouth Reviewed  Skin Reviewed  [ecchymosis]  Nails Reviewed     Diet Order:   Diet Order            Diet Heart Room service appropriate? Yes; Fluid consistency: Thin  Diet effective now                EDUCATION NEEDS:   Education needs have been addressed  Skin:  Skin Assessment: Reviewed RN Assessment  Last BM:  10/06/2019  Height:   Ht Readings from Last 1 Encounters:  10/07/19 5' 1" (1.549 m)   Weight:   Wt Readings from Last 1 Encounters:  10/07/19 71.7 kg  BMI:  Body mass index is 29.85 kg/m.  Estimated Nutritional Needs:   Kcal:  1800-2100  Protein:  90-100 grams  Fluid:  >/= 1.8 L/day  Leanne King, MS, RD, LDN Pager number available on Amion 

## 2019-10-08 NOTE — Progress Notes (Signed)
Ch visited with Pt and family again to complete AD. Notary Sherika helped notarize documents. Volunteer workers witnessed the signing. Copies given to family and added on Pt's file.  

## 2019-10-08 NOTE — Evaluation (Signed)
Physical Therapy Evaluation Patient Details Name: Julie Escobar MRN: [id number] DOB: 04-04-1951 Today's Date: 10/08/2019   History of Present Illness  Pt is a 69 y.o. female presenting to hospital 6/17 with increased SOB since Monday; chronic cough.  Pt admitted with COPD exacerbation with mild acute on chronic hypoxic respiratory failure.  PMH includes COPD, a-fib, OA, and htn.  Clinical Impression  Prior to hospital admission, pt was modified independent with ambulation (held onto furniture as needed in home and used 4ww in community); lives alone but has family nearby; 1 level home with ramp to enter.  Currently pt is modified independent with bed mobility; CGA with transfers; and CGA with short ambulation 10 feet x2 with RW (to bathroom and back).  SOB noted with ambulation limiting distance ambulated; pt on 4 L O2 (O2 sats 93% or greater and HR increased up to 122 bpm with activity--HR decreased to low 90's bpm with rest).  Pt would benefit from skilled PT to address noted impairments and functional limitations (see below for any additional details).  Upon hospital discharge, pt would benefit from [place].    Follow Up Recommendations Home health PT    Equipment Recommendations  3in1 (PT) (pt has RW at home already)    Recommendations for Other Services OT consult     Precautions / Restrictions Precautions Precautions: Fall Precaution Comments: Elevate HOB Restrictions Weight Bearing Restrictions: No      Mobility  Bed Mobility Overal bed mobility: Modified Independent             General bed mobility comments: Semi-supine to/from sitting edge of bed without any noted difficulties  Transfers Overall transfer level: Needs assistance Equipment used: Rolling walker (2 wheeled) Transfers: Sit to/from Stand Sit to Stand: Min guard         General transfer comment: mild increased effort to stand from bed x2 trials (initial vc's for hand placement) and from toilet x1 trial  (use of grab bar)  Ambulation/Gait Ambulation/Gait assistance: Min guard Gait Distance (Feet):  (10 feet x2) Assistive device: Rolling walker (2 wheeled)   Gait velocity: decreased   General Gait Details: R LE internal rotation with knee rotating inwards towards L knee; partial step through gait pattern; steady with RW  Stairs            Wheelchair Mobility    Modified Rankin (Stroke Patients Only)       Balance Overall balance assessment: Needs assistance Sitting-balance support: No upper extremity supported;Feet supported Sitting balance-Leahy Scale: Normal Sitting balance - Comments: steady sitting putting on slippers   Standing balance support: Bilateral upper extremity supported Standing balance-Leahy Scale: Poor Standing balance comment: pt requiring B UE support on sink to wash hands; single UE support while managing underwear for toileting needs                             Pertinent Vitals/Pain Pain Assessment: Faces Faces Pain Scale: Hurts a little bit Pain Location: did not specify Pain Descriptors / Indicators: Discomfort Pain Intervention(s): Limited activity within patient's tolerance;Monitored during session;Repositioned;Other (comment) (RN notified regarding pt mentioning pain end of session)    Home Living Family/patient expects to be discharged to:: Private residence Living Arrangements: Alone (Family lives close by) Available Help at Discharge: Family Type of Home: Mobile home Home Access: Ramped entrance     [place]: [place]: Environmental consultant - 2 wheels;Walker - 4 wheels;Cane - quad;Tub bench;Grab bars -  tub/shower      Prior Function Level of Independence: Needs assistance   Gait / Transfers Assistance Needed: Ambulates within home without AD (holds onto furniture as needed); uses 4ww in community  ADL's / Homemaking Assistance Needed: Granddaughter assists with Tax inspector Dominance         Extremity/Trunk Assessment   Upper Extremity Assessment Upper Extremity Assessment: Generalized weakness    Lower Extremity Assessment Lower Extremity Assessment: Generalized weakness    Cervical / Trunk Assessment Cervical / Trunk Assessment: Other exceptions Cervical / Trunk Exceptions: forward head/shoulders  Communication   Communication: No difficulties  Cognition Arousal/Alertness: Awake/alert Behavior During Therapy: WFL for tasks assessed/performed Overall Cognitive Status: Within Functional Limits for tasks assessed                                        General Comments   Nursing cleared pt for participation in physical therapy.  Pt agreeable to PT session but requesting to go to bathroom.    Exercises  Transfers and mobility with RW   Assessment/Plan    PT Assessment Patient needs continued PT services  PT Problem List Decreased strength;Decreased activity tolerance;Decreased balance;Decreased mobility;Decreased knowledge of use of DME;Cardiopulmonary status limiting activity;Pain       PT Treatment Interventions DME instruction;Gait training;Functional mobility training;Therapeutic activities;Therapeutic exercise;Balance training;Patient/family education    PT Goals (Current goals can be found in the Care Plan section)  Acute Rehab PT Goals Patient Stated Goal: to improve strength and mobility PT Goal Formulation: With patient Time For Goal Achievement: 10/22/19 Potential to Achieve Goals: Good    Frequency Min 2X/week   Barriers to discharge        Co-evaluation               AM-PAC PT "6 Clicks" Mobility  Outcome Measure Help needed turning from your back to your side while in a flat bed without using bedrails?: None Help needed moving from lying on your back to sitting on the side of a flat bed without using bedrails?: None Help needed moving to and from a bed to a chair (including a wheelchair)?: A Little Help needed  standing up from a chair using your arms (e.g., wheelchair or bedside chair)?: A Little Help needed to walk in hospital room?: A Little Help needed climbing 3-5 steps with a railing? : A Lot 6 Click Score: 19    End of Session Equipment Utilized During Treatment: Gait belt;Oxygen (4 L O2 via nasal cannula) Activity Tolerance: Other (comment) (Limited d/t SOB with activity) Patient left: in bed;with call bell/phone within reach;with bed alarm set;with nursing/sitter in room;Other (comment) (B LE's elevated on pillow) Nurse Communication: Mobility status;Precautions;Other (comment) (pt's pain, HR, and SOB during session) PT Visit Diagnosis: Other abnormalities of gait and mobility (R26.89);Muscle weakness (generalized) (M62.81);Difficulty in walking, not elsewhere classified (R26.2)    Time: 1700-1749 PT Time Calculation (min) (ACUTE ONLY): 26 min   Charges:   PT Evaluation $PT Eval Low Complexity: 1 Low PT Treatments $Therapeutic Activity: 8-22 mins       Hendricks Limes, PT 10/08/19, 10:03 AM

## 2019-10-08 NOTE — Evaluation (Signed)
Occupational Therapy Evaluation Patient Details Name: Julie Escobar MRN: [id number] DOB: 07-30-1950 Today's Date: 10/08/2019    History of Present Illness Pt is a 69 y.o. female presenting to hospital 6/17 with increased SOB since Monday; chronic cough.  Pt admitted with COPD exacerbation with mild acute on chronic hypoxic respiratory failure.  PMH includes COPD, a-fib, OA, and htn.   Clinical Impression   Ms Bucknam presents in bed this morning following PT session pleasant, declining ADL participation and functional mobility on 3 attempts. She reports increased SOB from baseline, however states primary limitation with ADL and functional mobility is chronic b/l knee pain that limits standing or sitting for extended periods. Pt is on 4L O2 at baseline and during session, O2 sats >97% t/o. Prior to hospitalization, pt was Mod I in ADL; sits for showering/dressing; does her own laundry while seated. She lives alone with nearby family who visit daily, take her grocery shopping and assist with cleaning. Pt uses a 4WW for community ambulation and endorses using furniture for stability if she starts to feel "off balance." Pt educated re: the role of OT, the importance of mobility, safe DME use in the home, home/routine modifications to prevent falls, falls prevention strategies and energy conservation strategies. She will benefit from skilled acute OT, and HHOT upon discharge, to address falls prevention strategies, safe use of DME, balance and activity tolerance to maximize pt independence and reduce safety risks.      Follow Up Recommendations  Home health OT    Equipment Recommendations  None recommended by OT    Recommendations for Other Services       Precautions / Restrictions Precautions Precautions: Fall Precaution Comments: Elevate HOB Restrictions Weight Bearing Restrictions: No      Mobility Bed Mobility Overal bed mobility: Modified Independent             General bed  mobility comments: Deferred per pt request 2/2 b/l knee pain  Transfers Overall transfer level: Needs assistance Equipment used: Rolling walker (2 wheeled) Transfers: Sit to/from Stand Sit to Stand: Min guard         General transfer comment: Deferred per pt request 2/2 b/l knee pain    Balance Overall balance assessment: Needs assistance Sitting-balance support: No upper extremity supported;Feet supported Sitting balance-Leahy Scale: Normal Sitting balance - Comments: steady sitting putting on slippers   Standing balance support: Bilateral upper extremity supported Standing balance-Leahy Scale: Poor Standing balance comment: pt requiring B UE support on sink to wash hands; single UE support while managing underwear for toileting needs                           ADL either performed or assessed with clinical judgement   ADL                                         General ADL Comments: Pt politely declined ADL participation/functional mobility attempts x3, reported walking to the bathroom prior and current b/l knee pain. Does take medications at bed level with Setup A from RN. O2 sats taken during evaluation, >97% throughout     Vision Baseline Vision/History: Wears glasses Wears Glasses: Reading only Additional Comments: Family will bring glasses later     Perception     Praxis      Pertinent Vitals/Pain Pain Assessment: 0-10 Pain Score: 7  Faces Pain Scale: Hurts a little bit Pain Location: chronic b/l knee pain Pain Descriptors / Indicators: Discomfort;Aching;Constant Pain Intervention(s): Limited activity within patient's tolerance     Hand Dominance     Extremity/Trunk Assessment Upper Extremity Assessment Upper Extremity Assessment: LUE deficits/detail;RUE deficits/detail RUE Deficits / Details: ~150* AROM in shoulder flexion, 3+/5 strength, good grip LUE Deficits / Details: ~80* AROM/~120* PROM in shoulder flexion, <3/5  strength, good grip   Lower Extremity Assessment Lower Extremity Assessment: Defer to PT evaluation   Cervical / Trunk Assessment Cervical / Trunk Assessment: Other exceptions Cervical / Trunk Exceptions: forward head/shoulders   Communication Communication Communication: No difficulties   Cognition Arousal/Alertness: Awake/alert Behavior During Therapy: WFL for tasks assessed/performed Overall Cognitive Status: Within Functional Limits for tasks assessed                                     General Comments       Exercises Other Exercises Other Exercises: Pt educated re: the role of OT, the importance of mobility, safe DME use in the home, home/routine modifications to prevent falls, falls prevention strategies and energy conservation strategies.   Shoulder Instructions      Home Living Family/patient expects to be discharged to:: Private residence Living Arrangements: Alone (Family lives close by) Available Help at Discharge: Family Type of Home: Mobile home Home Access: Ramped entrance     Home Layout: One level     Bathroom Shower/Tub: Tub/shower unit (garden tub)   Bathroom Toilet: Standard     Home Equipment: Environmental consultant - 2 wheels;Walker - 4 wheels;Cane - quad;Tub bench;Grab bars - tub/shower;Hand held shower head (portable O2)          Prior Functioning/Environment Level of Independence: Needs assistance  Gait / Transfers Assistance Needed: Ambulates within home without AD (holds onto furniture as needed); uses 4ww in community; falls reported ADL's / Homemaking Assistance Needed: Pt Mod I with ADL, sits for showering/dressing; does her own laundry while seated; family takes her grocery shopping, eats mostly Development worker, community dinners; Granddaughter assists with cleaning   Comments: Pt on 4L O2 at baseline, has portable O2 (battery lasts ~4 hrs)        OT Problem List: Decreased strength;Decreased range of motion;Pain;Cardiopulmonary status limiting  activity;Impaired UE functional use;Decreased knowledge of use of DME or AE;Decreased activity tolerance;Impaired balance (sitting and/or standing)      OT Treatment/Interventions: Self-care/ADL training;Therapeutic exercise;Therapeutic activities;Energy conservation;DME and/or AE instruction;Patient/family education    OT Goals(Current goals can be found in the care plan section) Acute Rehab OT Goals Patient Stated Goal: To be able to breathe better OT Goal Formulation: With patient Time For Goal Achievement: 10/22/19 Potential to Achieve Goals: Good ADL Goals Pt Will Perform Lower Body Dressing: sit to/from stand;with adaptive equipment;with modified independence (using LRAD and incoporating >2 falls prevention strategies) Pt Will Transfer to Toilet: with modified independence;ambulating;bedside commode;regular height toilet (with safe use of LRAD and incorporating >2 falls prevention strategies) Additional ADL Goal #1: Pt will independently verbalize >3 falls prevention strategies during ADL participation to improve pt safety awareness  OT Frequency: Min 2X/week   Barriers to D/C:            Co-evaluation              AM-PAC OT "6 Clicks" Daily Activity     Outcome Measure Help from another person eating meals?: None Help from another person  taking care of personal grooming?: None Help from another person toileting, which includes using toliet, bedpan, or urinal?: A Little Help from another person bathing (including washing, rinsing, drying)?: A Little Help from another person to put on and taking off regular upper body clothing?: None Help from another person to put on and taking off regular lower body clothing?: A Little 6 Click Score: 21   End of Session Nurse Communication: Other (comment) (RN present for meds)  Activity Tolerance: Patient limited by pain Patient left: in bed;with call bell/phone within reach;with bed alarm set  OT Visit Diagnosis: Other  abnormalities of gait and mobility (R26.89);Pain;Muscle weakness (generalized) (M62.81) Pain - Right/Left: Left (B knees) Pain - part of body: Knee                Time: 0935-1005 OT Time Calculation (min): 30 min Charges:  OT General Charges $OT Visit: 1 Visit OT Evaluation $OT Eval Low Complexity: 1 Low OT Treatments $Self Care/Home Management : 8-22 mins  Maurilio Lovely, OTS 10/08/19, 11:47 AM

## 2019-10-08 NOTE — Progress Notes (Cosign Needed)
Transition of Care [facility]) - Progression Note    Patient Details  Name: Julie Escobar MRN: [id number] Date of Birth: 1950-12-08  Transition [facility] Care [facility]) CM/SW Contact  Barrie Dunker, RN Phone Number: 10/08/2019, 3:23 PM  Clinical Narrative:    Patient suffers from COPD and Weakness which impairs their ability to perform daily activities like ADLs in the home.  A walking aid will not resolve issue with performing activities of daily living. A wheelchair will allow patient to safely perform daily activities. Patient is not able to propel themselves in the home using a standard weight wheelchair due to weakness. Patient can self propel in the lightweight wheelchair. Length of need 99 months. Accessories: elevating leg rests Back Cushion wheel locks, extensions and anti-tippers        Expected Discharge Plan and Services                                                 Social Determinants of Health (SDOH) Interventions    Readmission Risk Interventions No flowsheet data found.

## 2019-10-08 NOTE — TOC Initial Note (Signed)
Transition of Care [facility]) - Initial/Assessment Note    Patient Details  Name: Julie Escobar MRN: [id number] Date of Birth: 1950-10-26  Transition of Care [facility]) CM/SW Contact:    Barrie Dunker, RN Phone Number: 10/08/2019, 3:29 PM  Clinical Narrative:                 Spoke with the family and the patient, the patient lives alone but her daughter lives next door there are 3 adult children that will be helping her, she has a RW and a ramp at home but needs a Wheelchair, I notified Zack with [employer] of the need, She is set up with [facility] for pT, OT and aide She will need transport thru EMS to get inside the home, she has Oxygen at home at 4 liters  Expected Discharge Plan: Home w Home Health Services Barriers to Discharge: Continued Medical Work up   Patient Goals and CMS Choice Patient states their goals for this hospitalization and ongoing recovery are:: go home      Expected Discharge Plan and Services Expected Discharge Plan: Home w Home Health Services   Discharge Planning Services: CM Consult   Living arrangements for the past 2 months: Single Family Home                 DME Arranged: Community education officer wheelchair with seat cushion DME Agency: [employer] Date DME Agency Contacted: 10/08/19 Time DME Agency Contacted: 1528 Representative spoke with at DME Agency: Zack HH Arranged: PT, OT, Nurse's Aide HH Agency: [facility] Date [facility] Agency Contacted: 10/08/19 Time HH Agency Contacted: 1528 Representative spoke with at [facility] Agency: [place]  Prior Living Arrangements/Services Living arrangements for the past 2 months: Single Family Home Lives with:: Self (daughters live next door and will be assisting, they will stay with her) Patient language and need for interpreter reviewed:: Yes Do you feel safe going back to the place where you live?: Yes      Need for Family Participation in Patient Care: No (Comment) Care giver support system in place?: Yes  (comment) Current home services: DME (rolling walker, BSC, ramp) Criminal Activity/Legal Involvement Pertinent to Current Situation/Hospitalization: No - Comment as needed  Activities of Daily Living Home Assistive Devices/Equipment: Oxygen, Walker (specify type) (4 wheel-only when going out of house) ADL Screening (condition at time of admission) Patient's cognitive ability adequate to safely complete daily activities?: Yes Is the patient deaf or have difficulty hearing?: No Does the patient have difficulty seeing, even when wearing glasses/contacts?: No Does the patient have difficulty concentrating, remembering, or making decisions?: No Patient able to express need for assistance with ADLs?: Yes Does the patient have difficulty dressing or bathing?: No Independently performs ADLs?: Yes (appropriate for developmental age) Does the patient have difficulty walking or climbing stairs?: No Weakness of Legs: Both Weakness of Arms/Hands: None  Permission Sought/Granted   Permission granted to share information with : Yes, Verbal Permission Granted              Emotional Assessment Appearance:: Appears stated age Attitude/Demeanor/Rapport: Engaged Affect (typically observed): Appropriate Orientation: : Oriented to Self, Oriented to Place, Oriented to  Time, Oriented to Situation Alcohol / Substance Use: Not Applicable Psych Involvement: No (comment)  Admission diagnosis:  COPD exacerbation (HCC) [J44.1] Patient Active Problem List   Diagnosis Date Noted  . Malnutrition of moderate degree 10/08/2019  . COPD exacerbation (HCC) 10/07/2019   PCP:  Mick Sell, MD Pharmacy:   [facility] -  [place], [place] [address] Phone: [phone] Fax: [phone]     Social Determinants of Health (SDOH) Interventions    Readmission Risk Interventions No flowsheet data found.

## 2019-10-08 NOTE — Progress Notes (Signed)
Ch visited with Pt in response to an OR for AD. Pt's stepdaughter Julie Escobar bedside at this time. Ch introduced self and asked them if they requested AD completion. They let Ch know that they have been wanting to complete the AD for a while now. Ch gave them AD education and let them the process required to complete it. Pt reported that daughter Julie Escobar will be listed as her Healthcare PoA. Julie Escobar called Julie Escobar to come to the hospital to complete the document today and not let it go to the weekend when not many people will be around to complete it. Pt was not really wanting to do the completion today stating that she is really tired. Ch checked the front-desk for volunteer availability, to have ready for AD signing and witnessing today. Volunteers will be available until 4:00 pm.

## 2019-10-08 NOTE — Plan of Care (Signed)
  Problem: Education: Goal: Knowledge of [facility] Education information/materials will improve Outcome: Progressing Goal: Emotional status will improve Outcome: Progressing Goal: Mental status will improve Outcome: Progressing Goal: Verbalization of understanding the information provided will improve Outcome: Progressing   Problem: Education: Goal: Emotional status will improve Outcome: Progressing   Problem: Education: Goal: Mental status will improve Outcome: Progressing   

## 2019-10-09 DIAGNOSIS — E440 Moderate protein-calorie malnutrition: Secondary | ICD-10-CM

## 2019-10-09 MED ORDER — GABAPENTIN 100 MG PO CAPS
100.0000 mg | ORAL_CAPSULE | Freq: Every day | ORAL | 2 refills | Status: AC
Start: 2019-10-09 — End: 2020-10-08

## 2019-10-09 MED ORDER — AZITHROMYCIN 250 MG PO TABS: 250.0000 mg | ORAL_TABLET | Freq: Every day | ORAL | 0 refills | Status: AC

## 2019-10-09 MED ORDER — PREDNISONE 20 MG PO TABS: 40.0000 mg | ORAL_TABLET | Freq: Every day | ORAL | 0 refills | Status: AC

## 2019-10-09 NOTE — Progress Notes (Signed)
Patient d/c home. Instructions given to patient, verbalized understanding. EMS to transport patient home.

## 2019-10-09 NOTE — Progress Notes (Signed)
Offered to assist with bath.  Pt says she wants to wait until after her breathing treatment because she just doesn't feel great at this time.  She said she will let us know when she is ready to bathe.

## 2019-10-09 NOTE — Discharge Instructions (Signed)
COPD and Physical Activity Chronic obstructive pulmonary disease (COPD) is a long-term (chronic) condition that affects the lungs. COPD is a general term that can be used to describe many different lung problems that cause lung swelling (inflammation) and limit airflow, including chronic bronchitis and emphysema. The main symptom of COPD is shortness of breath, which makes it harder to do even simple tasks. This can also make it harder to exercise and be active. Talk with your health care provider about treatments to help you breathe better and actions you can take to prevent breathing problems during physical activity. What are the benefits of exercising with COPD? Exercising regularly is an important part of a healthy lifestyle. You can still exercise and do physical activities even though you have COPD. Exercise and physical activity improve your shortness of breath by increasing blood flow (circulation). This causes your heart to pump more oxygen through your body. Moderate exercise can improve your:  Oxygen use.  Energy level.  Shortness of breath.  Strength in your breathing muscles.  Heart health.  Sleep.  Self-esteem and feelings of self-worth.  Depression, stress, and anxiety levels. Exercise can benefit everyone with COPD. The severity of your disease may affect how hard you can exercise, especially at first, but everyone can benefit. Talk with your health care provider about how much exercise is safe for you, and which activities and exercises are safe for you. What actions can I take to prevent breathing problems during physical activity?  Sign up for a pulmonary rehabilitation program. This type of program may include: ? Education about lung diseases. ? Exercise classes that teach you how to exercise and be more active while improving your breathing. This usually involves:  Exercise using your lower extremities, such as a stationary bicycle.  About 30 minutes of exercise, 2  to 5 times per week, for 6 to 12 weeks  Strength training, such as push ups or leg lifts. ? Nutrition education. ? Group classes in which you can talk with others who also have COPD and learn ways to manage stress.  If you use an oxygen tank, you should use it while you exercise. Work with your health care provider to adjust your oxygen for your physical activity. Your resting flow rate is different from your flow rate during physical activity.  While you are exercising: ? Take slow breaths. ? Pace yourself and do not try to go too fast. ? Purse your lips while breathing out. Pursing your lips is similar to a kissing or whistling position. ? If doing exercise that uses a quick burst of effort, such as weight lifting:  Breathe in before starting the exercise.  Breathe out during the hardest part of the exercise (such as raising the weights). Where to find support You can find support for exercising with COPD from:  Your health care provider.  A pulmonary rehabilitation program.  Your local health department or community health programs.  Support groups, online or in-person. Your health care provider may be able to recommend support groups. Where to find more information You can find more information about exercising with COPD from:  American Lung Association: lung.org.  COPD Foundation: copdfoundation.org. Contact a health care provider if:  Your symptoms get worse.  You have chest pain.  You have nausea.  You have a fever.  You have trouble talking or catching your breath.  You want to start a new exercise program or a new activity. Summary  COPD is a general term that can   be used to describe many different lung problems that cause lung swelling (inflammation) and limit airflow. This includes chronic bronchitis and emphysema.  Exercise and physical activity improve your shortness of breath by increasing blood flow (circulation). This causes your heart to provide more  oxygen to your body.  Contact your health care provider before starting any exercise program or new activity. Ask your health care provider what exercises and activities are safe for you. This information is not intended to replace advice given to you by your health care provider. Make sure you discuss any questions you have with your health care provider. Document Revised: 07/29/2018 Document Reviewed: 05/01/2017 Elsevier Patient Education  2020 [employer].   Chronic Obstructive Pulmonary Disease Chronic obstructive pulmonary disease (COPD) is a long-term (chronic) lung problem. When you have COPD, it is hard for air to get in and out of your lungs. Usually the condition gets worse over time, and your lungs will never return to normal. There are things you can do to keep yourself as healthy as possible.  Your doctor may treat your condition with: ? Medicines. ? Oxygen. ? Lung surgery.  Your doctor may also recommend: ? Rehabilitation. This includes steps to make your body work better. It may involve a team of specialists. ? Quitting smoking, if you smoke. ? Exercise and changes to your diet. ? Comfort measures (palliative care). Follow these instructions at home: Medicines  Take over-the-counter and prescription medicines only as told by your doctor.  Talk to your doctor before taking any cough or allergy medicines. You may need to avoid medicines that cause your lungs to be dry. Lifestyle  If you smoke, stop. Smoking makes the problem worse. If you need help quitting, ask your doctor.  Avoid being around things that make your breathing worse. This may include smoke, chemicals, and fumes.  Stay active, but remember to rest as well.  Learn and use tips on how to relax.  Make sure you get enough sleep. Most adults need at least 7 hours of sleep every night.  Eat healthy foods. Eat smaller meals more often. Rest before meals. Controlled breathing Learn and use tips on how to  control your breathing as told by your doctor. Try:  Breathing in (inhaling) through your nose for 1 second. Then, pucker your lips and breath out (exhale) through your lips for 2 seconds.  Putting one hand on your belly (abdomen). Breathe in slowly through your nose for 1 second. Your hand on your belly should move out. Pucker your lips and breathe out slowly through your lips. Your hand on your belly should move in as you breathe out.  Controlled coughing Learn and use controlled coughing to clear mucus from your lungs. Follow these steps: 1. Lean your head a little forward. 2. Breathe in deeply. 3. Try to hold your breath for 3 seconds. 4. Keep your mouth slightly open while coughing 2 times. 5. Spit any mucus out into a tissue. 6. Rest and do the steps again 1 or 2 times as needed. General instructions  Make sure you get all the shots (vaccines) that your doctor recommends. Ask your doctor about a flu shot and a pneumonia shot.  Use oxygen therapy and pulmonary rehabilitation if told by your doctor. If you need home oxygen therapy, ask your doctor if you should buy a tool to measure your oxygen level (oximeter).  Make a COPD action plan with your doctor. This helps you to know what to do if you  feel worse than usual.  Manage any other conditions you have as told by your doctor.  Avoid going outside when it is very hot, cold, or humid.  Avoid people who have a sickness you can catch (contagious).  Keep all follow-up visits as told by your doctor. This is important. Contact a doctor if:  You cough up more mucus than usual.  There is a change in the color or thickness of the mucus.  It is harder to breathe than usual.  Your breathing is faster than usual.  You have trouble sleeping.  You need to use your medicines more often than usual.  You have trouble doing your normal activities such as getting dressed or walking around the house. Get help right away if:  You have  shortness of breath while resting.  You have shortness of breath that stops you from: ? Being able to talk. ? Doing normal activities.  Your chest hurts for longer than 5 minutes.  Your skin color is more blue than usual.  Your pulse oximeter shows that you have low oxygen for longer than 5 minutes.  You have a fever.  You feel too tired to breathe normally. Summary  Chronic obstructive pulmonary disease (COPD) is a long-term lung problem.  The way your lungs work will never return to normal. Usually the condition gets worse over time. There are things you can do to keep yourself as healthy as possible.  Take over-the-counter and prescription medicines only as told by your doctor.  If you smoke, stop. Smoking makes the problem worse. This information is not intended to replace advice given to you by your health care provider. Make sure you discuss any questions you have with your health care provider. Document Revised: 03/21/2017 Document Reviewed: 05/13/2016 Elsevier Patient Education  2020 [employer]   Chronic Obstructive Pulmonary Disease Chronic obstructive pulmonary disease (COPD) is a long-term (chronic) lung problem. When you have COPD, it is hard for air to get in and out of your lungs. Usually the condition gets worse over time, and your lungs will never return to normal. There are things you can do to keep yourself as healthy as possible.  Your doctor may treat your condition with: ? Medicines. ? Oxygen. ? Lung surgery.  Your doctor may also recommend: ? Rehabilitation. This includes steps to make your body work better. It may involve a team of specialists. ? Quitting smoking, if you smoke. ? Exercise and changes to your diet. ? Comfort measures (palliative care). Follow these instructions at home: Medicines  Take over-the-counter and prescription medicines only as told by your doctor.  Talk to your doctor before taking any cough or allergy medicines. You  may need to avoid medicines that cause your lungs to be dry. Lifestyle  If you smoke, stop. Smoking makes the problem worse. If you need help quitting, ask your doctor.  Avoid being around things that make your breathing worse. This may include smoke, chemicals, and fumes.  Stay active, but remember to rest as well.  Learn and use tips on how to relax.  Make sure you get enough sleep. Most adults need at least 7 hours of sleep every night.  Eat healthy foods. Eat smaller meals more often. Rest before meals. Controlled breathing Learn and use tips on how to control your breathing as told by your doctor. Try:  Breathing in (inhaling) through your nose for 1 second. Then, pucker your lips and breath out (exhale) through your lips for 2 seconds.  Putting one hand on your belly (abdomen). Breathe in slowly through your nose for 1 second. Your hand on your belly should move out. Pucker your lips and breathe out slowly through your lips. Your hand on your belly should move in as you breathe out.  Controlled coughing Learn and use controlled coughing to clear mucus from your lungs. Follow these steps: 7. Lean your head a little forward. 8. Breathe in deeply. 9. Try to hold your breath for 3 seconds. 10. Keep your mouth slightly open while coughing 2 times. 11. Spit any mucus out into a tissue. 12. Rest and do the steps again 1 or 2 times as needed. General instructions  Make sure you get all the shots (vaccines) that your doctor recommends. Ask your doctor about a flu shot and a pneumonia shot.  Use oxygen therapy and pulmonary rehabilitation if told by your doctor. If you need home oxygen therapy, ask your doctor if you should buy a tool to measure your oxygen level (oximeter).  Make a COPD action plan with your doctor. This helps you to know what to do if you feel worse than usual.  Manage any other conditions you have as told by your doctor.  Avoid going outside when it is very hot,  cold, or humid.  Avoid people who have a sickness you can catch (contagious).  Keep all follow-up visits as told by your doctor. This is important. Contact a doctor if:  You cough up more mucus than usual.  There is a change in the color or thickness of the mucus.  It is harder to breathe than usual.  Your breathing is faster than usual.  You have trouble sleeping.  You need to use your medicines more often than usual.  You have trouble doing your normal activities such as getting dressed or walking around the house. Get help right away if:  You have shortness of breath while resting.  You have shortness of breath that stops you from: ? Being able to talk. ? Doing normal activities.  Your chest hurts for longer than 5 minutes.  Your skin color is more blue than usual.  Your pulse oximeter shows that you have low oxygen for longer than 5 minutes.  You have a fever.  You feel too tired to breathe normally. Summary  Chronic obstructive pulmonary disease (COPD) is a long-term lung problem.  The way your lungs work will never return to normal. Usually the condition gets worse over time. There are things you can do to keep yourself as healthy as possible.  Take over-the-counter and prescription medicines only as told by your doctor.  If you smoke, stop. Smoking makes the problem worse. This information is not intended to replace advice given to you by your health care provider. Make sure you discuss any questions you have with your health care provider. Document Revised: 03/21/2017 Document Reviewed: 05/13/2016 Elsevier Patient Education  2020 [employer].

## 2019-10-09 NOTE — Discharge Summary (Signed)
Physician Discharge Summary  Julie Escobar ERD:[id number] DOB: 11-04-1950 DOA: 10/07/2019  PCP: Mick Sell, MD  Admit date: 10/07/2019 Discharge date: 10/09/2019  Admitted From: Home Disposition:  Home  Recommendations for Outpatient Follow-up:  1. Follow up with PCP in 1-2 weeks 2. Please obtain BMP/CBC in one week 3. Please follow up on the following pending results:None  Home Health:Yes Equipment/Devices: Home oxygen, rolling walker, wheelchair discharge Condition: Stable CODE STATUS: Full Diet recommendation: Heart Healthy   Brief/Interim Summary: JanetEdwardsis a68 y.o.Caucasianfemalewith a known history of atrial fibrillation, osteoarthritis and COPD, who presented to the emergency room with acute onset of worsening dyspnea with generalized weaknesswith associated cough which has been mainly dry and wheezing that are not significantly worse from her baseline. She has been requiring more oxygen with ambulation in the ER. Admitted for COPD exacerbation.  She was treated with Zithromax, prednisone and breathing treatments.  She responded well.  She was back to her baseline oxygen requirement of 4L.  She was discharged on 3 more days of Zithromax and prednisone.  And she will continue her home inhalers.  She was complaining of that her both legs are becoming more sensitive to touch.  We will start her on low-dose gabapentin and ask her to follow-up with PCP for titration of her dose.  We will continue rest of her home medications and follow-up with her PCP.  Discharge Diagnoses:  Active Problems:   COPD exacerbation (HCC)   Malnutrition of moderate degree  Discharge Instructions  Discharge Instructions    Diet - low sodium heart healthy   Complete by: As directed    Discharge instructions   Complete by: As directed    It was pleasure taking care of you. You are being given three more days of prednisone 40 mg daily and Zithromax 250 mg daily, please take it  as directed.  Once you finish this prednisone you can resume your home dose of prednisone. I am giving you gabapentin at a lower dose for your legs sensitivity, please follow up with your primary care physician for further titration and management.   Increase activity slowly   Complete by: As directed      Allergies as of 10/09/2019      Reactions   Other Itching, Rash   Latex   Penicillins Rash      Medication List    STOP taking these medications   doxycycline 100 MG capsule Commonly known as: VIBRAMYCIN     TAKE these medications   acetaminophen 500 MG tablet Commonly known as: TYLENOL Take by mouth.   albuterol 108 (90 Base) MCG/ACT inhaler Commonly known as: VENTOLIN HFA   ALPRAZolam 0.25 MG tablet Commonly known as: XANAX Take 0.25 mg by mouth 2 (two) times daily as needed.   azithromycin 250 MG tablet Commonly known as: ZITHROMAX Take 1 tablet (250 mg total) by mouth daily for 3 days. What changed: See the new instructions.   Cholecalciferol 25 MCG (1000 UT) capsule Take by mouth.   cyanocobalamin 1000 MCG/ML injection Commonly known as: (VITAMIN B-12) Inject into the muscle.   diclofenac Sodium 1 % Gel Commonly known as: VOLTAREN diclofenac 1 % topical gel   diltiazem 300 MG 24 hr capsule Commonly known as: CARDIZEM CD Take 300 mg by mouth daily.   ferrous sulfate 325 (65 FE) MG tablet Take 325 mg by mouth daily with breakfast.   fluticasone 50 MCG/ACT nasal spray Commonly known as: FLONASE Place 1 spray into the nose daily.  Fluticasone-Salmeterol 250-50 MCG/DOSE Aepb Commonly known as: ADVAIR Inhale 1 puff into the lungs 2 (two) times daily.   furosemide 20 MG tablet Commonly known as: LASIX Take 20 mg by mouth 2 (two) times daily.   gabapentin 100 MG capsule Commonly known as: Neurontin Take 1 capsule (100 mg total) by mouth daily.   guaiFENesin 600 MG 12 hr tablet Commonly known as: MUCINEX Take 600 mg by mouth 2 (two) times  daily.   ipratropium-albuterol 0.5-2.5 (3) MG/3ML Soln Commonly known as: DUONEB SMARTSIG:3 Milliliter(s) Via Nebulizer Every 6 Hours   loratadine 10 MG tablet Commonly known as: CLARITIN loratadine 10 mg tablet  Take 1 tablet every day by oral route.   melatonin 3 MG Tabs tablet Take 3 mg by mouth at bedtime.   meloxicam 15 MG tablet Commonly known as: MOBIC Take 15 mg by mouth daily.   pantoprazole 40 MG tablet Commonly known as: PROTONIX Take 40 mg by mouth daily.   potassium chloride SA 20 MEQ tablet Commonly known as: KLOR-CON Take 20 mEq by mouth daily.   predniSONE 5 MG tablet Commonly known as: DELTASONE Take 5 mg by mouth daily. What changed: Another medication with the same name was added. Make sure you understand how and when to take each.   predniSONE 20 MG tablet Commonly known as: DELTASONE Take 2 tablets (40 mg total) by mouth daily with breakfast for 3 days. Start taking on: October 10, 2019 What changed: You were already taking a medication with the same name, and this prescription was added. Make sure you understand how and when to take each.   Spiriva HandiHaler 18 MCG inhalation capsule Generic drug: tiotropium 1 capsule daily.   traMADol 50 MG tablet Commonly known as: ULTRAM Take 50 mg by mouth every 6 (six) hours as needed.   Xarelto 20 MG Tabs tablet Generic drug: rivaroxaban Take 20 mg by mouth daily.            Durable Medical Equipment  (From admission, onward)         Start     Ordered   10/08/19 1522  For home use only DME lightweight manual wheelchair with seat cushion  Once       Comments: Patient suffers from COPD and Weakness which impairs their ability to perform daily activities like ADLs in the home.  A walking aid will not resolve issue with performing activities of daily living. A wheelchair will allow patient to safely perform daily activities. Patient is not able to propel themselves in the home using a standard weight  wheelchair due to weakness. Patient can self propel in the lightweight wheelchair. Length of need 99 months. Accessories: elevating leg rests Back Cushion wheel locks, extensions and anti-tippers.   10/08/19 1551   10/08/19 1357  For home use only DME 3 n 1  Once        10/08/19 1356          Follow-up Information    Mick Sell, MD. Schedule an appointment as soon as possible for a visit.   Specialty: Infectious Diseases Contact information: [address] [facility] - INFECTIOUS DISEASE [place] [address] [phone]              Allergies  Allergen Reactions  . Other Itching and Rash    Latex  . Penicillins Rash    Consultations:  None  Procedures/Studies: DG Chest 2 View  Result Date: 10/07/2019 CLINICAL DATA:  Shortness of breath  for 4 days, history of COPD EXAM: CHEST - 2 VIEW COMPARISON:  Radiograph 09/16/2013 FINDINGS: Chronic elevation of the right hemidiaphragm is similar to prior. There is a diffusely hyperinflated appearance of the lungs with flattening of the diaphragms. A linear lucency along the right lung periphery is favored to reflect a skin fold given visualization of vessels and fissures beyond this line and lack of continuation. No convincing pneumothorax or effusion. No consolidation features of edema. Slight interstitial coarsening towards the bases with apical lucency may reflect emphysematous change. Atherosclerotic plaque within the normal caliber aorta. No acute osseous or soft tissue abnormality. Degenerative changes are present in the imaged spine and shoulders. IMPRESSION: 1. Linear lucency along the right lung periphery is favored to reflect a skin fold rather than pneumothorax. Could consider repositioning and reimaging if there is clinical ambiguity. 2. No acute cardiopulmonary abnormality. 3. Chronic elevation of the right hemidiaphragm. Additional features compatible with COPD. 4.  Aortic Atherosclerosis  (ICD10-I70.0). Electronically Signed   By: Lovena Le M.D.   On: 10/07/2019 16:00    Subjective: Patient was feeling better when seen today.  She was complaining of bilateral legs becoming more sensitive to touch.  Per patient this is been going on for months and it is gradually worsening.  She experiences pain if somebody touches her legs.  Discharge Exam: Vitals:   10/09/19 0848 10/09/19 1450  BP: 139/66 134/69  Pulse: 91 91  Resp: 19 16  Temp: (!) 97.5 F (36.4 C) 98.2 F (36.8 C)  SpO2: 98% 98%   Vitals:   10/08/19 1942 10/08/19 2335 10/09/19 0848 10/09/19 1450  BP:  136/83 139/66 134/69  Pulse:  93 91 91  Resp:  16 19 16   Temp:  97.6 F (36.4 C) (!) 97.5 F (36.4 C) 98.2 F (36.8 C)  TempSrc:  Oral Oral Oral  SpO2: 95% 99% 98% 98%  Weight:      Height:        General: Pt is alert, awake, not in acute distress Cardiovascular: RRR, S1/S2 +, no rubs, no gallops Respiratory: CTA bilaterally, no wheezing, no rhonchi Abdominal: Soft, NT, ND, bowel sounds + Extremities: no edema, no cyanosis   The results of significant diagnostics from this hospitalization (including imaging, microbiology, ancillary and laboratory) are listed below for reference.    Microbiology: Recent Results (from the past 240 hour(s))  SARS Coronavirus 2 by RT PCR (hospital order, performed in [facility] hospital lab) Nasopharyngeal Nasopharyngeal Swab     Status: None   Collection Time: 10/07/19  7:53 PM   Specimen: Nasopharyngeal Swab  Result Value Ref Range Status   SARS Coronavirus 2 NEGATIVE NEGATIVE Final    Comment: (NOTE) SARS-CoV-2 target nucleic acids are NOT DETECTED.  The SARS-CoV-2 RNA is generally detectable in upper and lower respiratory specimens during the acute phase of infection. The lowest concentration of SARS-CoV-2 viral copies this assay can detect is 250 copies / mL. A negative result does not preclude SARS-CoV-2 infection and should not be used as the sole basis  for treatment or other patient management decisions.  A negative result may occur with improper specimen collection / handling, submission of specimen other than nasopharyngeal swab, presence of viral mutation(s) within the areas targeted by this assay, and inadequate number of viral copies (<250 copies / mL). A negative result must be combined with clinical observations, patient history, and epidemiological information.  Fact Sheet for Patients:   StrictlyIdeas.no  Fact Sheet for Healthcare Providers: BankingDealers.co.za  This test is not yet approved or  cleared by the [place] and has been authorized for detection and/or diagnosis of SARS-CoV-2 by FDA under an Emergency Use Authorization (EUA).  This EUA will remain in effect (meaning this test can be used) for the duration of the COVID-19 declaration under Section 564(b)(1) of the Act, 21 U.S.C. section 360bbb-3(b)(1), unless the authorization is terminated or revoked sooner.  Performed at [facility], [address] [place], [place] [address]      Labs: BNP (last 3 results) No results for input(s): BNP in the last 8760 hours. Basic Metabolic Panel: Recent Labs  Lab 10/07/19 1415 10/08/19 0548  NA 139 140  K 4.3 3.7  CL 90* 92*  CO2 37* 43*  GLUCOSE 103* 148*  BUN 6* 6*  CREATININE 0.40* 0.39*  CALCIUM 9.4 9.1   Liver Function Tests: No results for input(s): AST, ALT, ALKPHOS, BILITOT, PROT, ALBUMIN in the last 168 hours. No results for input(s): LIPASE, AMYLASE in the last 168 hours. No results for input(s): AMMONIA in the last 168 hours. CBC: Recent Labs  Lab 10/07/19 1032 10/08/19 0548  WBC 6.3 3.6*  HGB 10.6* 10.7*  HCT 33.1* 34.3*  MCV 99.7 101.8*  PLT 380 368   Cardiac Enzymes: No results for input(s): CKTOTAL, CKMB, CKMBINDEX, TROPONINI in the last 168 hours. BNP: Invalid input(s): POCBNP CBG: No results for input(s): GLUCAP  in the last 168 hours. D-Dimer No results for input(s): DDIMER in the last 72 hours. Hgb A1c No results for input(s): HGBA1C in the last 72 hours. Lipid Profile No results for input(s): CHOL, HDL, LDLCALC, TRIG, CHOLHDL, LDLDIRECT in the last 72 hours. Thyroid function studies No results for input(s): TSH, T4TOTAL, T3FREE, THYROIDAB in the last 72 hours.  Invalid input(s): FREET3 Anemia work up No results for input(s): VITAMINB12, FOLATE, FERRITIN, TIBC, IRON, RETICCTPCT in the last 72 hours. Urinalysis    Component Value Date/Time   COLORURINE Yellow 03/30/2012 1703   APPEARANCEUR Hazy 03/30/2012 1703   LABSPEC 1.009 03/30/2012 1703   PHURINE 7.0 03/30/2012 1703   GLUCOSEU Negative 03/30/2012 1703   HGBUR Negative 03/30/2012 1703   BILIRUBINUR Negative 03/30/2012 1703   KETONESUR Negative 03/30/2012 1703   PROTEINUR 30 mg/dL 19/50/9326 7124   NITRITE Negative 03/30/2012 1703   LEUKOCYTESUR Negative 03/30/2012 1703   Sepsis Labs Invalid input(s): PROCALCITONIN,  WBC,  LACTICIDVEN Microbiology Recent Results (from the past 240 hour(s))  SARS Coronavirus 2 by RT PCR (hospital order, performed in [facility] hospital lab) Nasopharyngeal Nasopharyngeal Swab     Status: None   Collection Time: 10/07/19  7:53 PM   Specimen: Nasopharyngeal Swab  Result Value Ref Range Status   SARS Coronavirus 2 NEGATIVE NEGATIVE Final    Comment: (NOTE) SARS-CoV-2 target nucleic acids are NOT DETECTED.  The SARS-CoV-2 RNA is generally detectable in upper and lower respiratory specimens during the acute phase of infection. The lowest concentration of SARS-CoV-2 viral copies this assay can detect is 250 copies / mL. A negative result does not preclude SARS-CoV-2 infection and should not be used as the sole basis for treatment or other patient management decisions.  A negative result may occur with improper specimen collection / handling, submission of specimen other than nasopharyngeal swab,  presence of viral mutation(s) within the areas targeted by this assay, and inadequate number of viral copies (<250 copies / mL). A negative result must be combined with clinical observations, patient history, and epidemiological information.  Fact Sheet  for Patients:   BoilerBrush.com.cyhttps://www.fda.gov/media/136312/download  Fact Sheet for Healthcare Providers: https://pope.com/https://www.fda.gov/media/136313/download  This test is not yet approved or  cleared by the [place] FDA and has been authorized for detection and/or diagnosis of SARS-CoV-2 by FDA under an Emergency Use Authorization (EUA).  This EUA will remain in effect (meaning this test can be used) for the duration of the COVID-19 declaration under Section 564(b)(1) of the Act, 21 U.S.C. section 360bbb-3(b)(1), unless the authorization is terminated or revoked sooner.  Performed at [facility], [address] [place], [place] [address]     Time coordinating discharge: Over 30 minutes  SIGNED:  Arnetha CourserSumayya Rooney Gladwin, MD  [facility] 10/09/2019, 3:47 PM  If 7PM-7AM, please contact night-coverage [email or web address]  This record has been created using Conservation officer, historic buildingsDragon voice recognition software. Errors have been sought and corrected,but may not always be located. Such creation errors do not reflect on the standard of care.

## 2019-10-09 NOTE — TOC Transition Note (Signed)
Transition of Care [facility]) - CM/SW Discharge Note   Patient Details  Name: Julie Escobar MRN: [id number] Date of Birth: [birth year]  Transition of [facility]) CM/SW Contact:  Maud Deed, LCSW Phone Number: 10/09/2019, 11:39 AM   Clinical Narrative:    Pt medically stable for discharge per MD. Pt will be transported home via EMS. Pt will be followed by [facility] for PT, OT and aide. CSW notified [facility] of [facility]'s discharge. Pt has necessary DME.   Final next level of care: Home w Home Health Services Barriers to Discharge: No Barriers Identified   Patient Goals and CMS Choice Patient states their goals for this hospitalization and ongoing recovery are:: go home      Discharge Placement                Patient to be transferred to facility by: EMS Name of family member notified: Morrie Sheldon Patient and family notified of of transfer: 10/09/19  Discharge Plan and Services   Discharge Planning Services: CM Consult            DME Arranged: Community education officer wheelchair with seat cushion DME Agency: [employer] Date DME Agency Contacted: 10/08/19 Time DME Agency Contacted: 1528 Representative spoke with at DME Agency: [facility] HH Arranged: PT, OT, Nurse's Aide HH Agency: [facility] Date [facility] Agency Contacted: 10/08/19 Time HH Agency Contacted: 1528 Representative spoke with at [facility] Agency: [place]  Social Determinants of Health (SDOH) Interventions     Readmission Risk Interventions No flowsheet data found.

## 2019-10-09 NOTE — Plan of Care (Signed)
  Problem: Malnutrition  (NI-5.2) Goal: Food and/or nutrient delivery Description: Individualized approach for food/nutrient provision. Outcome: Adequate for Discharge   

## 2020-05-23 DEATH — deceased
